# Patient Record
Sex: Female | Born: 1985
Health system: Southern US, Community
[De-identification: ages and names within clinical notes are randomized; demographics above are authoritative.]

## PROBLEM LIST (undated history)

## (undated) ENCOUNTER — Inpatient Hospital Stay (HOSPITAL_COMMUNITY): Payer: Self-pay

## (undated) DIAGNOSIS — F32A Depression, unspecified: Secondary | ICD-10-CM

## (undated) DIAGNOSIS — F329 Major depressive disorder, single episode, unspecified: Secondary | ICD-10-CM

## (undated) DIAGNOSIS — Z789 Other specified health status: Secondary | ICD-10-CM

## (undated) HISTORY — PX: NO PAST SURGERIES: SHX2092

---

## 2018-09-02 ENCOUNTER — Telehealth: Payer: Self-pay | Admitting: Obstetrics and Gynecology

## 2018-09-02 ENCOUNTER — Ambulatory Visit: Payer: BLUE CROSS/BLUE SHIELD | Admitting: Obstetrics and Gynecology

## 2018-09-02 ENCOUNTER — Encounter: Payer: Self-pay | Admitting: Obstetrics and Gynecology

## 2018-09-02 VITALS — BP 126/84 | HR 71 | Ht 60.0 in | Wt 157.0 lb

## 2018-09-02 DIAGNOSIS — B9689 Other specified bacterial agents as the cause of diseases classified elsewhere: Secondary | ICD-10-CM

## 2018-09-02 DIAGNOSIS — N760 Acute vaginitis: Secondary | ICD-10-CM | POA: Diagnosis not present

## 2018-09-02 MED ORDER — METRONIDAZOLE 500 MG PO TABS: 500.0000 mg | ORAL_TABLET | Freq: Two times a day (BID) | ORAL | 0 refills | Status: AC

## 2018-09-02 NOTE — Telephone Encounter (Signed)
Called to confirm pharmacy with Pt and sent Rx.

## 2018-09-02 NOTE — Patient Instructions (Signed)

## 2018-09-02 NOTE — Progress Notes (Signed)
HPI:      [name] is a 32 y.o. G1P0 who LMP was Patient's last menstrual period was 08/28/2018 (exact date).  Subjective:   She presents today with complaint of vaginal odor that she "cannot get rid of".  She states that she had something similar several months ago and was treated for BV.  This is only the second time she is ever had BV.  She has just moved to this area from [place].  She denies new sexual partners and has no infidelity concerns.  Does not want to be tested for GC/CT.  She reports her latest Pap smear was "irregular" but that her HPV was negative. She also would like to discuss the possibility of pregnancy in the near future.  She reports that she has had one miscarriage.  She is currently using condoms for birth control.    Hx: The following portions of the patient's history were reviewed and updated as appropriate:             She  has no past medical history on file. She does not have a problem list on file. She  has no past surgical history on file. Her family history is not on file. She  reports that she has never smoked. She has never used smokeless tobacco. She reports that she does not drink alcohol or use drugs. She currently has no medications in their medication list. She has no allergies on file.       Review of Systems:  Review of Systems  Constitutional: Denied constitutional symptoms, night sweats, recent illness, fatigue, fever, insomnia and weight loss.  Eyes: Denied eye symptoms, eye pain, photophobia, vision change and visual disturbance.  Ears/Nose/Throat/Neck: Denied ear, nose, throat or neck symptoms, hearing loss, nasal discharge, sinus congestion and sore throat.  Cardiovascular: Denied cardiovascular symptoms, arrhythmia, chest pain/pressure, edema, exercise intolerance, orthopnea and palpitations.  Respiratory: Denied pulmonary symptoms, asthma, pleuritic pain, productive sputum, cough, dyspnea and wheezing.  Gastrointestinal:  Denied, gastro-esophageal reflux, melena, nausea and vomiting.  Genitourinary: See HPI for additional information.  Musculoskeletal: Denied musculoskeletal symptoms, stiffness, swelling, muscle weakness and myalgia.  Dermatologic: Denied dermatology symptoms, rash and scar.  Neurologic: Denied neurology symptoms, dizziness, headache, neck pain and syncope.  Psychiatric: Denied psychiatric symptoms, anxiety and depression.  Endocrine: Denied endocrine symptoms including hot flashes and night sweats.   Meds:   No current outpatient medications on file prior to visit.   No current facility-administered medications on file prior to visit.     Objective:     Vitals:   09/02/18 1123  BP: 126/84  Pulse: 71              Physical examination   Pelvic:   Vulva: Normal appearance.  No lesions.  Vagina: No lesions or abnormalities noted.  Support: Normal pelvic support.  Urethra No masses tenderness or scarring.  Meatus Normal size without lesions or prolapse.  Cervix: Normal appearance.  No lesions.  Anus: Normal exam.  No lesions.  Perineum: Normal exam.  No lesions.        Bimanual   Uterus: Normal size.  Non-tender.  Mobile.  AV.  Adnexae: No masses.  Non-tender to palpation.  Cul-de-sac: Negative for abnormality.   WET PREP: clue cells: present, KOH (yeast): negative, odor: present and trichomoniasis: negative Ph:  > 4.5   Assessment:    G1P0 There are no active problems to display for this patient.    1. Bacterial vulvovaginitis  Plan:            1.  Flagyl  2.  We discussed the use of acidophilus and lactobacillus/probiotics in detail.  Literature given.  3.  We discussed preconceptual issues including counseling use of prenatal vitamins timing of intercourse etc.  4.  We discussed her HPV and her "irregular Pap smear".  I have recommended that she has another Pap smear with HPV testing in 1 year. Orders No orders of the defined types were placed in  this encounter.   No orders of the defined types were placed in this encounter.     F/U  Return for Annual Physical. I spent 32 minutes involved in the care of this patient of which greater than 50% was spent discussing vaginal infection, preconceptual counseling, abnormal Pap smears and follow-up for all of the above.  All her questions were answered.  Finis Bud, M.D. 09/02/2018 12:17 PM

## 2018-09-02 NOTE — Progress Notes (Signed)
Pt presents today with symptoms of a bacterial infection. Symptoms include cramping, odor and frequent urination.

## 2018-09-02 NOTE — Telephone Encounter (Signed)
The patient is new to [facility] and our practice, and she thinks she forgot to tell the nurse/provider which pharmacy to snd her script to.  She would like to use [facility] on [address], and she would like to pick these up before she leaves the area to go home if at all possible, please advise, thanks.

## 2018-09-16 ENCOUNTER — Telehealth: Payer: Self-pay | Admitting: Obstetrics and Gynecology

## 2018-09-16 NOTE — Telephone Encounter (Signed)
Pt seen by dje and dx with bv. GIven flagyl. She is now having a white d/c and very itchy.  Advised pt to keep appt with mad.   Pt to see mad in the am.

## 2018-09-16 NOTE — Telephone Encounter (Signed)
He patient called and stated that she was in the office for bv and her medication has caused other symptoms now. The patient has made an apt with Dr. Tennis Must due to her provider being out of the office for a week.Thje patient would like a call back. Please advise.

## 2018-09-16 NOTE — Telephone Encounter (Signed)
I accidentally routed the message to [employer] and she is not in the office. Please advise previous message.

## 2018-09-16 NOTE — Telephone Encounter (Signed)
LMTRC

## 2018-09-17 ENCOUNTER — Encounter: Payer: Self-pay | Admitting: Obstetrics and Gynecology

## 2018-09-17 ENCOUNTER — Ambulatory Visit: Payer: BLUE CROSS/BLUE SHIELD | Admitting: Obstetrics and Gynecology

## 2018-09-17 VITALS — BP 110/77 | HR 78 | Ht 61.0 in | Wt 157.3 lb

## 2018-09-17 DIAGNOSIS — B3731 Acute candidiasis of vulva and vagina: Secondary | ICD-10-CM

## 2018-09-17 DIAGNOSIS — N898 Other specified noninflammatory disorders of vagina: Secondary | ICD-10-CM | POA: Diagnosis not present

## 2018-09-17 DIAGNOSIS — L292 Pruritus vulvae: Secondary | ICD-10-CM

## 2018-09-17 DIAGNOSIS — B373 Candidiasis of vulva and vagina: Secondary | ICD-10-CM

## 2018-09-17 MED ORDER — FLUCONAZOLE 150 MG PO TABS: 150.0000 mg | ORAL_TABLET | ORAL | 3 refills | Status: DC

## 2018-09-17 NOTE — Patient Instructions (Signed)
1.  Wet prep today shows yeast infection. 2.  Take Diflucan 150 mg orally every 3 days for 3 doses. 3.  Follow-up as needed.   Vaginal Yeast infection, Adult Vaginal yeast infection is a condition that causes soreness, swelling, and redness (inflammation) of the vagina. It also causes vaginal discharge. This is a common condition. Some women get this infection frequently. What are the causes? This condition is caused by a change in the normal balance of the yeast (candida) and bacteria that live in the vagina. This change causes an overgrowth of yeast, which causes the inflammation. What increases the risk? This condition is more likely to develop in:  Women who take antibiotic medicines.  Women who have diabetes.  Women who take birth control pills.  Women who are pregnant.  Women who douche often.  Women who have a weak defense (immune) system.  Women who have been taking steroid medicines for a long time.  Women who frequently wear tight clothing.  What are the signs or symptoms? Symptoms of this condition include:  White, thick vaginal discharge.  Swelling, itching, redness, and irritation of the vagina. The lips of the vagina (vulva) may be affected as well.  Pain or a burning feeling while urinating.  Pain during sex.  How is this diagnosed? This condition is diagnosed with a medical history and physical exam. This will include a pelvic exam. Your health care provider will examine a sample of your vaginal discharge under a microscope. Your health care provider may send this sample for testing to confirm the diagnosis. How is this treated? This condition is treated with medicine. Medicines may be over-the-counter or prescription. You may be told to use one or more of the following:  Medicine that is taken orally.  Medicine that is applied as a cream.  Medicine that is inserted directly into the vagina (suppository).  Follow these instructions at home:  Take or  apply over-the-counter and prescription medicines only as told by your health care provider.  Do not have sex until your health care provider has approved. Tell your sex partner that you have a yeast infection. That person should go to his or her health care provider if he or she develops symptoms.  Do not wear tight clothes, such as pantyhose or tight pants.  Avoid using tampons until your health care provider approves.  Eat more yogurt. This may help to keep your yeast infection from returning.  Try taking a sitz bath to help with discomfort. This is a warm water bath that is taken while you are sitting down. The water should only come up to your hips and should cover your buttocks. Do this 3-4 times per day or as told by your health care provider.  Do not douche.  Wear breathable, cotton underwear.  If you have diabetes, keep your blood sugar levels under control. Contact a health care provider if:  You have a fever.  Your symptoms go away and then return.  Your symptoms do not get better with treatment.  Your symptoms get worse.  You have new symptoms.  You develop blisters in or around your vagina.  You have blood coming from your vagina and it is not your menstrual period.  You develop pain in your abdomen. This information is not intended to replace advice given to you by your health care provider. Make sure you discuss any questions you have with your health care provider. Document Released: 09/26/2005 Document Revised: 05/30/2016 Document Reviewed: 06/20/2015 Elsevier  Interactive Patient Education  [employer].

## 2018-09-17 NOTE — Progress Notes (Signed)
Chief complaint: 1.  Vaginal discharge  32 year old married white female para 0-0-1-0, recently evaluated for vaginitis and treated for bacterial vaginosis.  Patient completed full course of Flagyl.  Since that time [place] the beach and has noted new onset vaginal itching, vulvar itching, and a white discharge.  She has not taken any other antibiotics.  She has been prone to yeast infections in the past. No abnormal uterine bleeding.  No UTI symptoms.  History reviewed. No pertinent past medical history. Past Surgical History:  Procedure Laterality Date  . NO PAST SURGERIES     Review of systems: Complete review of systems is negative except for that noted in the HPI  OBJECTIVE: BP 110/77   Pulse 78   Ht 5\' 1"  (1.549 m)   Wt 157 lb 4.8 oz (71.4 kg)   LMP 08/28/2018 (Exact Date)   BMI 29.72 kg/m  Pleasant well-appearing female no acute distress.  Alert and oriented.  Affect is appropriate. Pelvic exam: External genitalia-mild vulvar hyperemia BUS-normal Vagina-thick white discharge, curd-like present; wet prep taken Cervix-no lesions; no discharge Bimanual-not performed Rectovaginal-normal external exam  PROCEDURE: Wet prep KOH-positive yeast Normal saline-no clue cells; few white blood cells; no trichomonas  ASSESSMENT: 1.  Monilia vulvovaginitis  PLAN: 1.  Diflucan 150 mg orally every 3 days x 3 doses 2.  Literature regarding yeast infections and prevention given 3.  Return as needed  Brayton Mars, MD  Note: This dictation was prepared with Dragon dictation along with smaller phrase technology. Any transcriptional errors that result from this process are unintentional.

## 2018-11-22 ENCOUNTER — Inpatient Hospital Stay (HOSPITAL_COMMUNITY)
Admission: AD | Admit: 2018-11-22 | Discharge: 2018-11-23 | Disposition: A | Discharge status: Home / Self Care | Payer: BLUE CROSS/BLUE SHIELD | Attending: Obstetrics and Gynecology | Admitting: Obstetrics and Gynecology

## 2018-11-22 DIAGNOSIS — Z3A08 8 weeks gestation of pregnancy: Secondary | ICD-10-CM | POA: Insufficient documentation

## 2018-11-22 DIAGNOSIS — O209 Hemorrhage in early pregnancy, unspecified: Secondary | ICD-10-CM

## 2018-11-22 DIAGNOSIS — O208 Other hemorrhage in early pregnancy: Secondary | ICD-10-CM | POA: Insufficient documentation

## 2018-11-22 DIAGNOSIS — D259 Leiomyoma of uterus, unspecified: Secondary | ICD-10-CM | POA: Insufficient documentation

## 2018-11-22 DIAGNOSIS — O3411 Maternal care for benign tumor of corpus uteri, first trimester: Secondary | ICD-10-CM | POA: Insufficient documentation

## 2018-11-22 DIAGNOSIS — Z3491 Encounter for supervision of normal pregnancy, unspecified, first trimester: Secondary | ICD-10-CM

## 2018-11-22 HISTORY — DX: Other specified health status: Z78.9

## 2018-11-23 ENCOUNTER — Encounter (HOSPITAL_COMMUNITY): Payer: Self-pay | Admitting: *Deleted

## 2018-11-23 ENCOUNTER — Other Ambulatory Visit: Payer: Self-pay

## 2018-11-23 ENCOUNTER — Inpatient Hospital Stay (HOSPITAL_COMMUNITY): Payer: BLUE CROSS/BLUE SHIELD

## 2018-11-23 DIAGNOSIS — Z3A08 8 weeks gestation of pregnancy: Secondary | ICD-10-CM

## 2018-11-23 DIAGNOSIS — O208 Other hemorrhage in early pregnancy: Secondary | ICD-10-CM

## 2018-11-23 DIAGNOSIS — O3411 Maternal care for benign tumor of corpus uteri, first trimester: Secondary | ICD-10-CM | POA: Diagnosis not present

## 2018-11-23 DIAGNOSIS — D259 Leiomyoma of uterus, unspecified: Secondary | ICD-10-CM | POA: Diagnosis not present

## 2018-11-23 LAB — URINALYSIS, ROUTINE W REFLEX MICROSCOPIC
BILIRUBIN URINE: NEGATIVE
Glucose, UA: NEGATIVE mg/dL
KETONES UR: NEGATIVE mg/dL
LEUKOCYTES UA: NEGATIVE
NITRITE: NEGATIVE
PROTEIN: 30 mg/dL — AB
Specific Gravity, Urine: 1.001 — ABNORMAL LOW (ref 1.005–1.030)
pH: 6 (ref 5.0–8.0)

## 2018-11-23 LAB — CBC
HEMATOCRIT: 38.5 % (ref 36.0–46.0)
HEMOGLOBIN: 13 g/dL (ref 12.0–15.0)
MCH: 32.2 pg (ref 26.0–34.0)
MCHC: 33.8 g/dL (ref 30.0–36.0)
MCV: 95.3 fL (ref 80.0–100.0)
Platelets: 214 10*3/uL (ref 150–400)
RBC: 4.04 MIL/uL (ref 3.87–5.11)
RDW: 11.8 % (ref 11.5–15.5)
WBC: 8.4 10*3/uL (ref 4.0–10.5)

## 2018-11-23 LAB — HCG, QUANTITATIVE, PREGNANCY: hCG, Beta Chain, Quant, S: 32280 m[IU]/mL — ABNORMAL HIGH (ref <5)

## 2018-11-23 LAB — ABO/RH: ABO/RH(D): AB POS

## 2018-11-23 LAB — POCT PREGNANCY, URINE: PREG TEST UR: POSITIVE — AB

## 2018-11-23 NOTE — MAU Provider Note (Signed)
History     CSN: [id number]  Arrival date and time: 11/22/18 2349   First Provider Initiated Contact with Patient 11/23/18 0008      Chief Complaint  Patient presents with  . Vaginal Bleeding   HPI Ann Williams is a 32 y.o. G2P0010 at [redacted]w[redacted]d who presents with heavy vaginal bleeding. She states at 2300 she started having bright red vaginal bleeding with clots. She also reports cramping that she rates a 3/10 and has not tried anything for. She states she has had a miscarriage in the past and feels like this is the same amount of bleeding. She is also passing clots. Upon arrival to [facility]U, she had blood on hands and legs that was falling to the floor. She states she had an ultrasound in the office this week that showed a normal IUP with a heart rate.   OB History    Gravida  2   Para      Term      Preterm      AB  1   Living  0     SAB  1   TAB      Ectopic      Multiple      Live Births              Past Medical History:  Diagnosis Date  . Medical history non-contributory     Past Surgical History:  Procedure Laterality Date  . NO PAST SURGERIES      Family History  Problem Relation Age of Onset  . Breast cancer Neg Hx   . Ovarian cancer Neg Hx   . Colon cancer Neg Hx   . Diabetes Neg Hx     Social History   Tobacco Use  . Smoking status: Never Smoker  . Smokeless tobacco: Never Used  Substance Use Topics  . Alcohol use: Never    Frequency: Never  . Drug use: Never    Allergies: No Known Allergies  Medications Prior to Admission  Medication Sig Dispense Refill Last Dose  . Prenatal Vit-Fe Fumarate-FA (PRENATAL MULTIVITAMIN) TABS tablet Take 1 tablet by mouth daily at 12 noon.   11/22/2018 at Unknown time  . fluconazole (DIFLUCAN) 150 MG tablet Take 1 tablet (150 mg total) by mouth every 3 (three) days. For three doses 3 tablet 3     Review of Systems  Constitutional: Negative.  Negative for fatigue and fever.  HENT: Negative.    Respiratory: Negative.  Negative for shortness of breath.   Cardiovascular: Negative.  Negative for chest pain.  Gastrointestinal: Positive for abdominal pain. Negative for constipation, diarrhea, nausea and vomiting.  Genitourinary: Positive for vaginal bleeding. Negative for dysuria.  Neurological: Negative.  Negative for dizziness and headaches.   Physical Exam   Last menstrual period 09/22/2018.  Physical Exam  Nursing note and vitals reviewed. Constitutional: She is oriented to person, place, and time. She appears well-developed and well-nourished. No distress.  HENT:  Head: Normocephalic.  Eyes: Pupils are equal, round, and reactive to light.  Cardiovascular: Normal rate, regular rhythm and normal heart sounds.  Respiratory: Effort normal and breath sounds normal. No respiratory distress.  GI: Soft. Bowel sounds are normal. She exhibits no distension. There is no tenderness.  Genitourinary:  Genitourinary Comments: Bright red vaginal bleeding on perineum and down inner thighs. Small amount of bright red bleeding in vault and one small clot removed. Cervix closed and thick.   Neurological: She is alert and oriented to person,  place, and time.  Skin: Skin is warm and dry.  Psychiatric: She has a normal mood and affect. Her behavior is normal. Judgment and thought content normal.    MAU Course  Procedures Results for orders placed or performed during the hospital encounter of 11/22/18 (from the past 24 hour(s))  Pregnancy, urine POC     Status: Abnormal   Collection Time: 11/23/18 12:00 AM  Result Value Ref Range   Preg Test, Ur POSITIVE (A) NEGATIVE  Urinalysis, Routine w reflex microscopic     Status: Abnormal   Collection Time: 11/23/18 12:02 AM  Result Value Ref Range   Color, Urine YELLOW YELLOW   APPearance CLEAR CLEAR   Specific Gravity, Urine 1.001 (L) 1.005 - 1.030   pH 6.0 5.0 - 8.0   Glucose, UA NEGATIVE NEGATIVE mg/dL   Hgb urine dipstick LARGE (A) NEGATIVE    Bilirubin Urine NEGATIVE NEGATIVE   Ketones, ur NEGATIVE NEGATIVE mg/dL   Protein, ur 30 (A) NEGATIVE mg/dL   Nitrite NEGATIVE NEGATIVE   Leukocytes, UA NEGATIVE NEGATIVE   RBC / HPF 0-5 0 - 5 RBC/hpf   WBC, UA 0-5 0 - 5 WBC/hpf   Bacteria, UA RARE (A) NONE SEEN   Squamous Epithelial / LPF 0-5 0 - 5  CBC     Status: None   Collection Time: 11/23/18 12:22 AM  Result Value Ref Range   WBC 8.4 4.0 - 10.5 K/uL   RBC 4.04 3.87 - 5.11 MIL/uL   Hemoglobin 13.0 12.0 - 15.0 g/dL   HCT 38.5 36.0 - 46.0 %   MCV 95.3 80.0 - 100.0 fL   MCH 32.2 26.0 - 34.0 pg   MCHC 33.8 30.0 - 36.0 g/dL   RDW 11.8 11.5 - 15.5 %   Platelets 214 150 - 400 K/uL  hCG, quantitative, pregnancy     Status: Abnormal   Collection Time: 11/23/18 12:22 AM  Result Value Ref Range   hCG, Beta Chain, Quant, S 32,280 (H) <5 mIU/mL  ABO/Rh     Status: None (Preliminary result)   Collection Time: 11/23/18 12:22 AM  Result Value Ref Range   ABO/RH(D)      AB POS Performed at [facility], [address], [place], [place] [address]    US Ob Comp Less 14 Wks  Result Date: 11/23/2018 CLINICAL DATA:  Heavy bleeding. Estimated gestational age by LMP is 8 weeks 6 days. No quantitative beta hCG given. EXAM: OBSTETRIC <14 WK ULTRASOUND TECHNIQUE: Transabdominal ultrasound was performed for evaluation of the gestation as well as the maternal uterus and adnexal regions. COMPARISON:  None. FINDINGS: Intrauterine gestational sac: A single intrauterine gestational sac is present. The gestational sac is somewhat flattened, possibly related to uterine fibroid. Yolk sac:  Yolk sac is identified. Embryo:  Fetal pole is visualized. Cardiac Activity: Fetal cardiac activity is observed. Heart Rate: 182 bpm CRL: 18.7 mm   8 w 2 d                  [place] EDC: 07/03/2019 Subchorionic hemorrhage: A small subchorionic hemorrhage is demonstrated superiorly. Maternal uterus/adnexae: Uterus is anteverted. Hypoechoic myometrial mass demonstrated in the  anterior uterine fundus measuring up to about 3.2 cm in diameter consistent with a fibroid. Both ovaries are visualized and appear normal. No free fluid in the pelvis. IMPRESSION: Single intrauterine pregnancy. Estimated gestational age by crown-rump length is 8 weeks 2 days. Small subchorionic hemorrhage. An anterior uterine fibroid is present. Electronically Signed   By:  Lucienne Capers M.D.   On: 11/23/2018 00:59   MDM UA, UPT CBC, HCG ABO/Rh- AB Pos Wet prep and gc/chlamydia US OB Comp Less 14 weeks with Transvaginal  No subchorionic hemorrhage seen but patient reports one was noted on ultrasound in the office. Explained implications to patient and husband and reassurance provided.  Assessment and Plan   1. Normal intrauterine pregnancy on prenatal ultrasound in first trimester   2. Vaginal bleeding affecting early pregnancy   3. [redacted] weeks gestation of pregnancy    -Discharge home in stable condition -Vaginal bleeding precautions discussed -Patient advised to follow-up with [facility] as scheduled on 12/2 -Patient may return to MAU as needed or if her condition were to change or worsen  Wende Mott CNM 11/23/2018, 12:08 AM

## 2018-11-23 NOTE — MAU Note (Signed)
Pt reports to MAU c/o heavy vaginal bleeding that started around 2345 bright red with some clots. Pt reports a mild cramping that is a 3/10.

## 2018-11-23 NOTE — Discharge Instructions (Signed)
Pelvic Rest Pelvic rest may be recommended if:  Your placenta is partially or completely covering the opening of your cervix (placenta previa).  There is bleeding between the wall of the uterus and the amniotic sac in the first trimester of pregnancy (subchorionic hemorrhage).  You went into labor too early (preterm labor).  Based on your overall health and the health of your baby, your health care provider will decide if pelvic rest is right for you. How do I rest my pelvis? For as long as told by your health care provider:  Do not have sex, sexual stimulation, or an orgasm.  Do not use tampons. Do not douche. Do not put anything in your vagina.  Do not lift anything that is heavier than 10 lb (4.5 kg).  Avoid activities that take a lot of effort (are strenuous).  Avoid any activity in which your pelvic muscles could become strained.  When should I seek medical care? Seek medical care if you have:  Cramping pain in your lower abdomen.  Vaginal discharge.  A low, dull backache.  Regular contractions.  Uterine tightening.  When should I seek immediate medical care? Seek immediate medical care if:  You have vaginal bleeding and you are pregnant.  This information is not intended to replace advice given to you by your health care provider. Make sure you discuss any questions you have with your health care provider. Document Released: 04/13/2011 Document Revised: 05/24/2016 Document Reviewed: 06/20/2015 Elsevier Interactive Patient Education  2018 [place] Medications in Pregnancy   Acne: Benzoyl Peroxide Salicylic Acid  Backache/Headache: Tylenol: 2 regular strength every 4 hours OR              2 Extra strength every 6 hours  Colds/Coughs/Allergies: Benadryl (alcohol free) 25 mg every 6 hours as needed Breath right strips Claritin Cepacol throat lozenges Chloraseptic throat spray Cold-Eeze- up to three times per day Cough drops, alcohol  free Flonase (by prescription only) Guaifenesin Mucinex Robitussin DM (plain only, alcohol free) Saline nasal spray/drops Sudafed (pseudoephedrine) & Actifed ** use only after [redacted] weeks gestation and if you do not have high blood pressure Tylenol Vicks Vaporub Zinc lozenges Zyrtec   Constipation: Colace Ducolax suppositories Fleet enema Glycerin suppositories Metamucil Milk of magnesia Miralax Senokot Smooth move tea  Diarrhea: Kaopectate Imodium A-D  *NO pepto Bismol  Hemorrhoids: Anusol Anusol HC Preparation H Tucks  Indigestion: Tums Maalox Mylanta Zantac  Pepcid  Insomnia: Benadryl (alcohol free) 25mg  every 6 hours as needed Tylenol PM Unisom, no Gelcaps  Leg Cramps: Tums MagGel  Nausea/Vomiting:  Bonine Dramamine Emetrol Ginger extract Sea bands Meclizine  Nausea medication to take during pregnancy:  Unisom (doxylamine succinate 25 mg tablets) Take one tablet daily at bedtime. If symptoms are not adequately controlled, the dose can be increased to a maximum recommended dose of two tablets daily (1/2 tablet in the morning, 1/2 tablet mid-afternoon and one at bedtime). Vitamin B6 100mg  tablets. Take one tablet twice a day (up to 200 mg per day).  Skin Rashes: Aveeno products Benadryl cream or 25mg  every 6 hours as needed Calamine Lotion 1% cortisone cream  Yeast infection: Gyne-lotrimin 7 Monistat 7   **If taking multiple medications, please check labels to avoid duplicating the same active ingredients **take medication as directed on the label ** Do not exceed 4000 mg of tylenol in 24 hours **Do not take medications that contain aspirin or ibuprofen

## 2018-12-02 ENCOUNTER — Encounter (HOSPITAL_COMMUNITY): Payer: Self-pay

## 2018-12-02 ENCOUNTER — Inpatient Hospital Stay (HOSPITAL_COMMUNITY)
Admission: AD | Admit: 2018-12-02 | Discharge: 2018-12-02 | Disposition: A | Discharge status: Home / Self Care | Payer: BLUE CROSS/BLUE SHIELD | Attending: Obstetrics & Gynecology | Admitting: Obstetrics & Gynecology

## 2018-12-02 DIAGNOSIS — O209 Hemorrhage in early pregnancy, unspecified: Secondary | ICD-10-CM | POA: Insufficient documentation

## 2018-12-02 DIAGNOSIS — Z3A10 10 weeks gestation of pregnancy: Secondary | ICD-10-CM | POA: Diagnosis not present

## 2018-12-02 DIAGNOSIS — O26891 Other specified pregnancy related conditions, first trimester: Secondary | ICD-10-CM

## 2018-12-02 NOTE — MAU Provider Note (Signed)
History     CSN: [id number]  Arrival date and time: 12/02/18 2054   First Provider Initiated Contact with Patient 12/02/18 2142      Chief Complaint  Patient presents with  . Vaginal Bleeding   HPI Ann Williams is a 32 y.o. G2P0010 at [redacted]w[redacted]d who presents with vaginal bleeding. She states starting at 2000 she had bright red bleeding like a period and passed several small clots. She denies any pain. She states she had an appointment yesterday where they confirmed [facility] and did a pelvic exam.   OB History    Gravida  2   Para      Term      Preterm      AB  1   Living  0     SAB  1   TAB      Ectopic      Multiple      Live Births              Past Medical History:  Diagnosis Date  . Medical history non-contributory     Past Surgical History:  Procedure Laterality Date  . NO PAST SURGERIES      Family History  Problem Relation Age of Onset  . Breast cancer Neg Hx   . Ovarian cancer Neg Hx   . Colon cancer Neg Hx   . Diabetes Neg Hx     Social History   Tobacco Use  . Smoking status: Never Smoker  . Smokeless tobacco: Never Used  Substance Use Topics  . Alcohol use: Never    Frequency: Never  . Drug use: Never    Allergies: No Known Allergies  Medications Prior to Admission  Medication Sig Dispense Refill Last Dose  . Prenatal Vit-Fe Fumarate-FA (PRENATAL MULTIVITAMIN) TABS tablet Take 1 tablet by mouth daily at 12 noon.   12/02/2018 at Unknown time  . fluconazole (DIFLUCAN) 150 MG tablet Take 1 tablet (150 mg total) by mouth every 3 (three) days. For three doses 3 tablet 3     Review of Systems  Constitutional: Negative.  Negative for fatigue and fever.  HENT: Negative.   Respiratory: Negative.  Negative for shortness of breath.   Cardiovascular: Negative.  Negative for chest pain.  Gastrointestinal: Negative.  Negative for abdominal pain, constipation, diarrhea, nausea and vomiting.  Genitourinary: Positive for vaginal bleeding.  Negative for dysuria.  Neurological: Negative.  Negative for dizziness and headaches.   Physical Exam   Blood pressure 119/69, pulse 69, temperature 98.6 F (37 C), temperature source Oral, resp. rate 20, height 5' (1.524 m), weight 73.9 kg, last menstrual period 09/22/2018.  Physical Exam  Nursing note and vitals reviewed. Constitutional: She is oriented to person, place, and time. She appears well-developed and well-nourished. No distress.  HENT:  Head: Normocephalic.  Eyes: Pupils are equal, round, and reactive to light.  Cardiovascular: Normal rate, regular rhythm and normal heart sounds.  Respiratory: Effort normal and breath sounds normal. No respiratory distress.  GI: Soft. Bowel sounds are normal. She exhibits no distension. There is no tenderness.  Genitourinary:  Genitourinary Comments: Small amount of bright red bleeding in vault. No active bleeding. Cervix closed  Neurological: She is alert and oriented to person, place, and time.  Skin: Skin is warm and dry.  Psychiatric: She has a normal mood and affect. Her behavior is normal. Judgment and thought content normal.    MAU Course  Procedures  MDM AB Pos Limited bedside u/s shows IUP measuring  10w with FHR 158 bpm Small blood on exam.  Reassurance provided and discussed pelvic rest and return precautions  Assessment and Plan   1. Vaginal bleeding in pregnancy, first trimester   2. [redacted] weeks gestation of pregnancy    -Discharge home in stable condition -Vaginal bleeding precautions discussed -Patient advised to follow-up with [facility] as scheduled on 12/17 -Patient may return to MAU as needed or if her condition were to change or worsen  Wende Mott CNM 12/02/2018, 9:42 PM

## 2018-12-02 NOTE — Discharge Instructions (Signed)
Pelvic Rest Pelvic rest may be recommended if:  Your placenta is partially or completely covering the opening of your cervix (placenta previa).  There is bleeding between the wall of the uterus and the amniotic sac in the first trimester of pregnancy (subchorionic hemorrhage).  You went into labor too early (preterm labor).  Based on your overall health and the health of your baby, your health care provider will decide if pelvic rest is right for you. How do I rest my pelvis? For as long as told by your health care provider:  Do not have sex, sexual stimulation, or an orgasm.  Do not use tampons. Do not douche. Do not put anything in your vagina.  Do not lift anything that is heavier than 10 lb (4.5 kg).  Avoid activities that take a lot of effort (are strenuous).  Avoid any activity in which your pelvic muscles could become strained.  When should I seek medical care? Seek medical care if you have:  Cramping pain in your lower abdomen.  Vaginal discharge.  A low, dull backache.  Regular contractions.  Uterine tightening.  When should I seek immediate medical care? Seek immediate medical care if:  You have vaginal bleeding and you are pregnant.  This information is not intended to replace advice given to you by your health care provider. Make sure you discuss any questions you have with your health care provider. Document Released: 04/13/2011 Document Revised: 05/24/2016 Document Reviewed: 06/20/2015 Elsevier Interactive Patient Education  [employer].

## 2018-12-02 NOTE — MAU Note (Signed)
AVS signature pad not working. Pt signed paper copy of AVS

## 2018-12-02 NOTE — MAU Note (Signed)
PT SAYS  SHE STARTED CRAMPING AT 830-  ON LEFT SIDE . WAS HERE BEFORE  BC OF BLOOD CLOTS.  YESTERDAY IN OFFICE  HAD VAG  BLEEDING- NO U/S-  GOOD FHR.      SHE SAT  DOWN AT 815PM-   BLOOD ON CLOTHES- THEN ON LEGS-   HAS  PAPERTOWELS-   IN  TRIAGE.  - RED BLOOD .-  SMALL  AMT.    LAST SEX-  SAT.

## 2018-12-31 NOTE — L&D Delivery Note (Signed)
Delivery Note  CNM in house - called by Dr Ronita Hipps to attend birth HX PPROM in palliative care - IUGR and PPROM@24  wks  Labor and Birth: PPROM 02/27/2019 at 2100 Labor onset: 03/13/2019 ~ 0400  delivery of a non-viable female at 79 by Dr Harolyn Rutherford  breech position transferred baby to NICU team - unable to resuscitate with Apgar 0-0  CNM arrival after delivery - assumed care for Dr Ronita Hipps  Third Stage: Placenta delivered 0759 intact with maternal efforts Placenta disposition: pathology Uterine tone firm / bleeding small - Pitocin to IVF bolus rate  no laceration identified  Est. Blood Loss (mL): 845XM  Complications: fetal death anticipated due to IUGR and PPROM since [redacted] weeks gestation  Mom to special care unit - 1st floor  Newborn: Apgar Scores: 1-minute:  0                         5-minute: 0  Baby for transfer to [place] - uncertain final plans for baby at this time   [place], MSN, [facility] 03/13/2019, 8:09 AM

## 2019-02-03 ENCOUNTER — Encounter (HOSPITAL_COMMUNITY): Payer: Self-pay

## 2019-02-04 ENCOUNTER — Other Ambulatory Visit (HOSPITAL_COMMUNITY): Payer: Self-pay | Admitting: Obstetrics

## 2019-02-04 DIAGNOSIS — Z3A19 19 weeks gestation of pregnancy: Secondary | ICD-10-CM

## 2019-02-04 DIAGNOSIS — O365920 Maternal care for other known or suspected poor fetal growth, second trimester, not applicable or unspecified: Secondary | ICD-10-CM

## 2019-02-04 DIAGNOSIS — Z3689 Encounter for other specified antenatal screening: Secondary | ICD-10-CM

## 2019-02-09 ENCOUNTER — Encounter (HOSPITAL_COMMUNITY): Payer: Self-pay

## 2019-02-09 ENCOUNTER — Other Ambulatory Visit: Payer: Self-pay

## 2019-02-09 ENCOUNTER — Ambulatory Visit (HOSPITAL_COMMUNITY)
Admission: RE | Admit: 2019-02-09 | Discharge: 2019-02-09 | Disposition: A | Discharge status: Home / Self Care | Payer: BLUE CROSS/BLUE SHIELD | Source: Ambulatory Visit | Attending: Obstetrics | Admitting: Obstetrics

## 2019-02-09 ENCOUNTER — Other Ambulatory Visit (HOSPITAL_COMMUNITY): Payer: Self-pay | Admitting: Obstetrics

## 2019-02-09 DIAGNOSIS — Z3A20 20 weeks gestation of pregnancy: Secondary | ICD-10-CM

## 2019-02-09 DIAGNOSIS — Z3A19 19 weeks gestation of pregnancy: Secondary | ICD-10-CM

## 2019-02-09 DIAGNOSIS — Z3689 Encounter for other specified antenatal screening: Secondary | ICD-10-CM

## 2019-02-09 DIAGNOSIS — Z363 Encounter for antenatal screening for malformations: Secondary | ICD-10-CM | POA: Diagnosis not present

## 2019-02-09 DIAGNOSIS — O365990 Maternal care for other known or suspected poor fetal growth, unspecified trimester, not applicable or unspecified: Secondary | ICD-10-CM | POA: Diagnosis not present

## 2019-02-09 DIAGNOSIS — O365920 Maternal care for other known or suspected poor fetal growth, second trimester, not applicable or unspecified: Secondary | ICD-10-CM

## 2019-02-09 DIAGNOSIS — O4100X0 Oligohydramnios, unspecified trimester, not applicable or unspecified: Secondary | ICD-10-CM

## 2019-02-09 HISTORY — DX: Depression, unspecified: F32.A

## 2019-02-09 HISTORY — DX: Major depressive disorder, single episode, unspecified: F32.9

## 2019-02-10 ENCOUNTER — Other Ambulatory Visit (HOSPITAL_COMMUNITY): Payer: Self-pay | Admitting: *Deleted

## 2019-02-10 DIAGNOSIS — Z362 Encounter for other antenatal screening follow-up: Secondary | ICD-10-CM

## 2019-02-10 DIAGNOSIS — IMO0002 Reserved for concepts with insufficient information to code with codable children: Secondary | ICD-10-CM

## 2019-02-27 ENCOUNTER — Inpatient Hospital Stay (HOSPITAL_COMMUNITY)
Admission: AD | Admit: 2019-02-27 | Discharge: 2019-02-28 | Disposition: A | Discharge status: Home / Self Care | Payer: BLUE CROSS/BLUE SHIELD | Attending: Obstetrics and Gynecology | Admitting: Obstetrics and Gynecology

## 2019-02-27 DIAGNOSIS — O42919 Preterm premature rupture of membranes, unspecified as to length of time between rupture and onset of labor, unspecified trimester: Secondary | ICD-10-CM

## 2019-02-27 DIAGNOSIS — Z3A22 22 weeks gestation of pregnancy: Secondary | ICD-10-CM | POA: Insufficient documentation

## 2019-02-27 DIAGNOSIS — O42912 Preterm premature rupture of membranes, unspecified as to length of time between rupture and onset of labor, second trimester: Secondary | ICD-10-CM | POA: Insufficient documentation

## 2019-02-27 DIAGNOSIS — O365920 Maternal care for other known or suspected poor fetal growth, second trimester, not applicable or unspecified: Secondary | ICD-10-CM | POA: Insufficient documentation

## 2019-02-27 NOTE — MAU Note (Signed)
Was at work yesterday and bled thru clothes. Had u/s at office yesterday after bleeding and no visible cause for bleeding. No bleeding today. Leaked clear fld 3 times since 2100. Hx low fld with this pregnancy and seeing MFM. Had intercourse TUes. No pain

## 2019-02-28 ENCOUNTER — Encounter (HOSPITAL_COMMUNITY): Payer: Self-pay | Admitting: *Deleted

## 2019-02-28 DIAGNOSIS — O42012 Preterm premature rupture of membranes, onset of labor within 24 hours of rupture, second trimester: Secondary | ICD-10-CM

## 2019-02-28 DIAGNOSIS — Z3A22 22 weeks gestation of pregnancy: Secondary | ICD-10-CM | POA: Diagnosis not present

## 2019-02-28 DIAGNOSIS — O365920 Maternal care for other known or suspected poor fetal growth, second trimester, not applicable or unspecified: Secondary | ICD-10-CM | POA: Diagnosis not present

## 2019-02-28 DIAGNOSIS — O42912 Preterm premature rupture of membranes, unspecified as to length of time between rupture and onset of labor, second trimester: Secondary | ICD-10-CM | POA: Diagnosis not present

## 2019-02-28 DIAGNOSIS — N898 Other specified noninflammatory disorders of vagina: Secondary | ICD-10-CM | POA: Diagnosis present

## 2019-02-28 NOTE — Discharge Instructions (Signed)
Premature Rupture and Preterm Premature Rupture of Membranes  A sac made up of membranes surrounds your baby in the womb (uterus). Rupture of membranes is when this sac breaks open. This is also known as your "water breaking." When this sac breaks before labor starts, it is called premature rupture of membranes (PROM). If this happens before 37 weeks of being pregnant, it is called preterm premature rupture of membranes (PPROM). PPROM is serious. It needs medical care right away. What increases the risk of PPROM? PPROM is more likely to happen in women who:  Have an infection.  Have had PPROM before.  Have a cervix that is short.  Have bleeding during the second or third trimester.  Have a low BMI. This is a measure of body fat.  Smoke.  Use drugs.  Have a low socioeconomic status. What problems can be caused by PROM and PPROM? This condition creates health dangers for the mother and the baby. These include:  Giving birth to the baby too early (prematurely).  Getting a serious infection of the placenta (chorioamnionitis).  Having the placenta detach from the uterus early (placental abruption).  Squeezing of the umbilical cord.  Getting a serious infection after delivery. What are the signs of PROM and PPROM?  A sudden gush of fluid from the vagina.  A slow leak of fluid from the vagina.  Your underwear is wet. What should I do if I think my water broke? Call your doctor right away. You will need to go to the hospital to get checked right away. What happens if I am told that I have PROM or PPROM? You will have tests done at the hospital.  If you have PROM, you may be given medicine to start labor (be induced). This may be done if you are not having contractions during the 24 hours after your water broke.  If you have PPROM and are not having contractions, you may be given medicine to start labor. It will depend on how far along you are in your pregnancy. If you have  PPROM:  You and your baby will be watched closely to see if you have infections or other problems.  You may be given: ? An antibiotic medicine. This can stop an infection from starting. ? A steroid medicine. This can help your baby's lungs develop faster. ? A medicine to help prevent cerebral palsy in your baby. ? A medicine to stop early labor (preterm labor).  You may be told to stay in bed except to use the bathroom (bed rest).  You may be given medicine to start labor. This may be done if there are problems with you or the baby. Your treatment will depend on many factors. Contact a doctor if:  Your water breaks and you are not having contractions. Get help right away if:  Your water breaks before you are [redacted] weeks pregnant. Summary  When your water breaks before labor starts, it is called premature rupture of membranes (PROM).  When PROM happens before 37 weeks of pregnancy, it is called preterm premature rupture of membranes (PPROM).  If you are not having contractions, your labor may be started for you. This information is not intended to replace advice given to you by your health care provider. Make sure you discuss any questions you have with your health care provider. Document Released: 03/15/2009 Document Revised: 08/02/2017 Document Reviewed: 09/06/2016 Elsevier Interactive Patient Education  2019 [employer].

## 2019-02-28 NOTE — MAU Provider Note (Signed)
History     CSN: [id number]  Arrival date and time: 02/27/19 2333   First Provider Initiated Contact with Patient 02/28/19 0019      Chief Complaint  Patient presents with  . Vaginal Discharge   G2P0010 @22 .5 wks presenting with LOF. Leaking started around 9pm tonight. Had 3 episodes. Fluid is clear, watery, with pink tinge. Denies abd pain or ctx. Feeling FM. No fevers. Her pregnancy is complicated by persistent oligohydramnios and IUGR.    OB History    Gravida  2   Para      Term      Preterm      AB  1   Living  0     SAB  1   TAB      Ectopic      Multiple      Live Births              Past Medical History:  Diagnosis Date  . Depression   . Medical history non-contributory     Past Surgical History:  Procedure Laterality Date  . NO PAST SURGERIES      Family History  Problem Relation Age of Onset  . Hypertension Mother   . Hypertension Maternal Grandmother   . Breast cancer Neg Hx   . Ovarian cancer Neg Hx   . Colon cancer Neg Hx   . Diabetes Neg Hx     Social History   Tobacco Use  . Smoking status: Never Smoker  . Smokeless tobacco: Never Used  Substance Use Topics  . Alcohol use: Never    Frequency: Never  . Drug use: Never    Allergies: No Known Allergies  No medications prior to admission.    Review of Systems  Gastrointestinal: Negative for abdominal pain.  Genitourinary: Positive for vaginal discharge. Negative for vaginal bleeding.   Physical Exam   Blood pressure 124/80, pulse 73, temperature 98.5 F (36.9 C), temperature source Oral, resp. rate 17, height 5' (1.524 m), weight 77.1 kg, last menstrual period 09/22/2018.  Physical Exam  Nursing note and vitals reviewed. Constitutional: She is oriented to person, place, and time. She appears well-developed and well-nourished. No distress.  HENT:  Head: Normocephalic and atraumatic.  Neck: Normal range of motion.  Cardiovascular: Normal rate.  Respiratory:  Effort normal. No respiratory distress.  GI: Soft. She exhibits no distension. There is no abdominal tenderness.  gravid  Genitourinary:    Genitourinary Comments: SSE: +pool, fern pos, visually closed   Musculoskeletal: Normal range of motion.  Neurological: She is alert and oriented to person, place, and time.  Skin: Skin is warm and dry.  Psychiatric: She has a normal mood and affect.  FHT 147 [place]: none  No results found for this or any previous visit (from the past 24 hour(s)).  MAU Course  Procedures Orders Placed This Encounter  Procedures  . Urinalysis, Routine w reflex microscopic    Standing Status:   Standing    Number of Occurrences:   1  . Discharge patient    Order Specific Question:   Discharge disposition    Answer:   01-Home or Self Care [1]    Order Specific Question:   Discharge patient date    Answer:   02/28/2019   MDM Labs ordered and reviewed. PPROM confirmed. No evidence of chorio. Consult with Dr. Ronita Hipps, recommends expectant mngt outpt  (already discussed with pt at length in office, poor prognosis since persistent oligo and IUGR). Discussed with  pt and spouse. Strict return precautions (sings of infection or PTL). Stable for discharge home.  Assessment and Plan   1. [redacted] weeks gestation of pregnancy   2. Preterm premature rupture of membranes (PPROM) with unknown onset of labor    Discharge home Follow up with MFM in 3 days as scheduled Follow up with Dr. Ronita Hipps next week as scheduled PTL precautions Return precautions  Allergies as of 02/28/2019   No Known Allergies     Medication List    TAKE these medications   prenatal multivitamin Tabs tablet Take 1 tablet by mouth daily at 12 noon.      Julianne Handler, CNM 02/28/2019, 2:01 AM

## 2019-03-02 ENCOUNTER — Ambulatory Visit (HOSPITAL_COMMUNITY)
Admission: RE | Admit: 2019-03-02 | Discharge: 2019-03-02 | Disposition: A | Discharge status: Home / Self Care | Payer: BLUE CROSS/BLUE SHIELD | Source: Ambulatory Visit | Attending: Obstetrics | Admitting: Obstetrics

## 2019-03-02 ENCOUNTER — Inpatient Hospital Stay (HOSPITAL_COMMUNITY)
Admission: AD | Admit: 2019-03-02 | Discharge: 2019-03-05 | DRG: 833 | Disposition: A | Discharge status: Home / Self Care | Payer: BLUE CROSS/BLUE SHIELD | Attending: Obstetrics and Gynecology | Admitting: Obstetrics and Gynecology

## 2019-03-02 ENCOUNTER — Encounter (HOSPITAL_COMMUNITY): Payer: Self-pay

## 2019-03-02 ENCOUNTER — Other Ambulatory Visit: Payer: Self-pay

## 2019-03-02 ENCOUNTER — Ambulatory Visit (HOSPITAL_BASED_OUTPATIENT_CLINIC_OR_DEPARTMENT_OTHER): Payer: BLUE CROSS/BLUE SHIELD | Admitting: *Deleted

## 2019-03-02 VITALS — BP 134/91 | HR 92 | Wt 171.0 lb

## 2019-03-02 DIAGNOSIS — Z3A22 22 weeks gestation of pregnancy: Secondary | ICD-10-CM

## 2019-03-02 DIAGNOSIS — O321XX0 Maternal care for breech presentation, not applicable or unspecified: Secondary | ICD-10-CM | POA: Diagnosis present

## 2019-03-02 DIAGNOSIS — Z362 Encounter for other antenatal screening follow-up: Secondary | ICD-10-CM

## 2019-03-02 DIAGNOSIS — O4102X0 Oligohydramnios, second trimester, not applicable or unspecified: Secondary | ICD-10-CM | POA: Diagnosis not present

## 2019-03-02 DIAGNOSIS — Z3A23 23 weeks gestation of pregnancy: Secondary | ICD-10-CM

## 2019-03-02 DIAGNOSIS — O4692 Antepartum hemorrhage, unspecified, second trimester: Secondary | ICD-10-CM | POA: Diagnosis not present

## 2019-03-02 DIAGNOSIS — O365920 Maternal care for other known or suspected poor fetal growth, second trimester, not applicable or unspecified: Secondary | ICD-10-CM | POA: Diagnosis present

## 2019-03-02 DIAGNOSIS — O0990 Supervision of high risk pregnancy, unspecified, unspecified trimester: Secondary | ICD-10-CM

## 2019-03-02 DIAGNOSIS — O368120 Decreased fetal movements, second trimester, not applicable or unspecified: Secondary | ICD-10-CM | POA: Diagnosis not present

## 2019-03-02 DIAGNOSIS — O4290 Premature rupture of membranes, unspecified as to length of time between rupture and onset of labor, unspecified weeks of gestation: Secondary | ICD-10-CM | POA: Diagnosis not present

## 2019-03-02 DIAGNOSIS — O42912 Preterm premature rupture of membranes, unspecified as to length of time between rupture and onset of labor, second trimester: Principal | ICD-10-CM | POA: Diagnosis present

## 2019-03-02 DIAGNOSIS — IMO0002 Reserved for concepts with insufficient information to code with codable children: Secondary | ICD-10-CM

## 2019-03-02 DIAGNOSIS — O0992 Supervision of high risk pregnancy, unspecified, second trimester: Secondary | ICD-10-CM | POA: Diagnosis not present

## 2019-03-02 LAB — CBC
HCT: 34.5 % — ABNORMAL LOW (ref 36.0–46.0)
Hemoglobin: 11.9 g/dL — ABNORMAL LOW (ref 12.0–15.0)
MCH: 32.2 pg (ref 26.0–34.0)
MCHC: 34.5 g/dL (ref 30.0–36.0)
MCV: 93.2 fL (ref 80.0–100.0)
Platelets: 230 10*3/uL (ref 150–400)
RBC: 3.7 MIL/uL — ABNORMAL LOW (ref 3.87–5.11)
RDW: 12.2 % (ref 11.5–15.5)
WBC: 12.3 10*3/uL — ABNORMAL HIGH (ref 4.0–10.5)
nRBC: 0 % (ref 0.0–0.2)

## 2019-03-02 LAB — TYPE AND SCREEN
ABO/RH(D): AB POS
Antibody Screen: NEGATIVE

## 2019-03-02 LAB — ABO/RH: ABO/RH(D): AB POS

## 2019-03-02 MED ORDER — SODIUM CHLORIDE 0.9 % IV SOLN
250.0000 mL | INTRAVENOUS | Status: DC | PRN
  Administered 2019-03-03: 10 mL via INTRAVENOUS

## 2019-03-02 MED ORDER — SODIUM CHLORIDE 0.9 % IV SOLN
500.0000 mg | INTRAVENOUS | Status: AC
  Administered 2019-03-03: 500 mg via INTRAVENOUS
  Filled 2019-03-02: qty 500

## 2019-03-02 MED ORDER — ZOLPIDEM TARTRATE 5 MG PO TABS: 5.0000 mg | ORAL_TABLET | Freq: Every evening | ORAL | Status: DC | PRN

## 2019-03-02 MED ORDER — ACETAMINOPHEN 325 MG PO TABS: 650.0000 mg | ORAL_TABLET | ORAL | Status: DC | PRN

## 2019-03-02 MED ORDER — SODIUM CHLORIDE 0.9% FLUSH: 3.0000 mL | INTRAVENOUS | Status: DC | PRN

## 2019-03-02 MED ORDER — DOCUSATE SODIUM 100 MG PO CAPS
100.0000 mg | ORAL_CAPSULE | Freq: Every day | ORAL | Status: DC
  Administered 2019-03-03 – 2019-03-05 (×3): 100 mg via ORAL
  Filled 2019-03-02 (×3): qty 1

## 2019-03-02 MED ORDER — AMOXICILLIN 500 MG PO CAPS
500.0000 mg | ORAL_CAPSULE | Freq: Three times a day (TID) | ORAL | Status: DC
  Administered 2019-03-04 – 2019-03-05 (×3): 500 mg via ORAL
  Filled 2019-03-02 (×3): qty 1

## 2019-03-02 MED ORDER — SODIUM CHLORIDE 0.9% FLUSH
3.0000 mL | Freq: Two times a day (BID) | INTRAVENOUS | Status: DC
  Administered 2019-03-04: 3 mL via INTRAVENOUS

## 2019-03-02 MED ORDER — AZITHROMYCIN 250 MG PO TABS
500.0000 mg | ORAL_TABLET | Freq: Every day | ORAL | Status: DC
  Administered 2019-03-04 – 2019-03-05 (×2): 500 mg via ORAL
  Filled 2019-03-02 (×2): qty 2

## 2019-03-02 MED ORDER — PRENATAL MULTIVITAMIN CH
1.0000 | ORAL_TABLET | Freq: Every day | ORAL | Status: DC
  Administered 2019-03-03 – 2019-03-05 (×3): 1 via ORAL
  Filled 2019-03-02 (×3): qty 1

## 2019-03-02 MED ORDER — CALCIUM CARBONATE ANTACID 500 MG PO CHEW: 2.0000 | CHEWABLE_TABLET | ORAL | Status: DC | PRN

## 2019-03-02 MED ORDER — SODIUM CHLORIDE 0.9 % IV SOLN
2.0000 g | Freq: Four times a day (QID) | INTRAVENOUS | Status: AC
  Administered 2019-03-02 – 2019-03-04 (×8): 2 g via INTRAVENOUS
  Filled 2019-03-02 (×8): qty 2000

## 2019-03-02 NOTE — H&P (Signed)
NAMEJENASCIA, Ann Williams MEDICAL RECORD [id number] ACCOUNT [id number] DATE OF BIRTH:[birth year] FACILITY: [facility] LOCATION: [facility] PHYSICIAN:Phoebe Marter J. Jackson Fetters, MD  HISTORY AND PHYSICAL  DATE OF ADMISSION:  03/02/2019  ADMISSION DIAGNOSIS:  Previable preterm ruptured membranes at 22 weeks and 5 days, now for inpatient hospitalization to rule out chorioamnionitis.  HISTORY OF PRESENT ILLNESS:  She is a 33 year old white female G2 P1 with known IUGR and oligohydramnios during the course of the pregnancy, normal cell free DNA who presents now at 23 weeks status post a consultation on ultrasound in maternal fetal  medicine today for admission.  She has a history of leakage of fluid with positive fern and Nitrazine at 22 weeks and 5 days.  She denies fever, chills or abdominal tenderness.  She has intermittent vaginal spotting.  She reports fetal movement.  ALLERGIES:  She has no known drug allergies.  MEDICATIONS:  Prenatal vitamins and baby aspirin.  SOCIAL HISTORY:  She is a nonsmoker, nondrinker.  She denies domestic physical violence.  PAST MEDICAL HISTORY:  She has a history of 1 previous miscarriage in 2016.  PHYSICAL EXAMINATION: GENERAL:  She is a well-developed, well-nourished white female in no acute distress. HEENT:  Normal. NECK:  Supple, full range of motion. LUNGS:  Clear. HEART:  Regular rhythm. ABDOMEN:  Soft, gravid, nontender. PELVIC:  Deferred. EXTREMITIES:  No cords. NEUROLOGIC:  Nonfocal. SKIN:  Intact.  Maternal fetal medicine ultrasound reveals a breech-presenting fetus at 369 g.  Fetal heart tones 156 beats per minute.  CBC is pending.  Neonatal NICU consult done.  IMPRESSION: 1.  Previable preterm rupture of membranes. 2.  IUGR with lagging approximately 2 weeks. 3.  Normal cell-free fetal DNA.  PLAN:  Admit.  We will admit the PPROM IV and p.o. antibiotics per protocol.  NICU consult pending.  NICU consult will make a decision in conjunction with MFM for  inpatient hospitalization for long-term versus short term.  We will monitor signs and  symptoms of chorio.  Will not deliver for fetal indications at this time.  Check fetal heart tones q. shift.  Risks and benefits discussed.  Risks of breech delivery with fetal entrapment have been discussed.  The patient acknowledges and will proceed  with care plan as outlined at this time.  LN/NUANCE  D:03/02/2019 T:03/02/2019 JOB:[id number]

## 2019-03-02 NOTE — Progress Notes (Signed)
Patient states she was seen in  MAU 2/28 and was confirmed with PPROM. States she has continued to leak fluid. States she started noting a small amount of blood when she wipes this morning. States she is not feeling fetal movement. States she was instr'd to keep this appt today.

## 2019-03-02 NOTE — Consult Note (Signed)
Neonatology Consult to Antenatal Patient:  I was asked by Dr. Ronita Hipps to see this patient in order to provide antenatal counseling due to premature ROM and [redacted] weeks GA.  Ann Williams was admitted today at 58 0/[redacted] weeks GA. Pregnancy has been complicated by oligohydramnios and IUGR. She had SROM at about 2100 on 2/28 and presents today for admission. Ultrasound shows EFW of 389 grams, no fluid present, breech presentation. She is currently not having active labor. She is getting IV Ampicillin. A decision about betamethasone is pending. I recommended that, if the parents want resuscitation at 23 weeks, that betamethasone administration is likely to increase the baby's chances for survival.  I spoke with the patient, her husband, and baby's grandparents. I explained embryological development of the lungs to them, emphasizing the possibility of not being able to perform tracheal intubation in a baby of this size. Survival for infant's weighing less than 400 grams at birth is virtually nil. We discussed the worst case of delivery in the next 1-2 days, including usual DR management, possible respiratory complications and need for support, IV access, LOS, Mortality and Morbidity, and long term outcomes. We also talked about the improvement in survival and neurodevelopmental outcomes with each additional week of gestation, if she remains pregnant. They had several questions, which I answered. I offered a NICU tour to any interested family members and would be glad to come back if she has more questions later.  Thank you for asking me to see this patient.  Real Cons, MD Neonatologist  The total length of face-to-face or floor/unit time for this encounter was 30 minutes. Counseling and/or coordination of care was 25 minutes of the above.

## 2019-03-03 ENCOUNTER — Encounter (HOSPITAL_COMMUNITY): Payer: Self-pay | Admitting: *Deleted

## 2019-03-03 NOTE — Progress Notes (Signed)
Nurse called into room due to patient stating she is actively bleeding. After assessing white pad there was scant light pink mucous like discharge. No red blood. Pt states no pain or cramping. Will continue to watch patient.

## 2019-03-03 NOTE — Progress Notes (Signed)
Patient ID: Ann Williams, female   DOB: [birth year], 33 y.o.   MRN: [id number] HD#1 [redacted]w[redacted]d PPROM  S: NO complaints. No contraction. FM noted Occ LOF. Occ spotting. NO CP or SOB No abdominal pain, fever or chills  O: BP 105/71 (BP Location: Right Arm)   Pulse 70   Temp 98.1 F (36.7 C) (Oral)   Resp 18   Ht 5' (1.524 m)   Wt 77.6 kg   LMP 09/22/2018 (Exact Date)   SpO2 99%   BMI 33.40 kg/m   HEENT: nl Neck: supple Lungs: CTA CV: RRR ABD: Gravid, NT No CVAT EXT: neg cords Neuro: nonfocal Skin: intact  FHT 156  CBC    Component Value Date/Time   WBC 12.3 (H) 03/02/2019 1955   RBC 3.70 (L) 03/02/2019 1955   HGB 11.9 (L) 03/02/2019 1955   HCT 34.5 (L) 03/02/2019 1955   PLT 230 03/02/2019 1955   MCV 93.2 03/02/2019 1955   MCH 32.2 03/02/2019 1955   MCHC 34.5 03/02/2019 1955   RDW 12.2 03/02/2019 1955   A: PPROM at 23w 1d Breech IUGR with nl NIPS No s/s chorio  P: PPROM protocol BMZ discussed NICU consult done FHT q shift

## 2019-03-04 NOTE — Progress Notes (Signed)
Patient ID: Ann Williams, female   DOB: 01/27/1986, 33 y.o.   MRN: [id number] HD#2 [redacted]w[redacted]d PPROM  S: Had one episode of increased fluid leakage last night. No contraction. FM noted Occ LOF. Occ spotting. NO CP or SOB No abdominal pain, fever or chills  O: BP 127/82 (BP Location: Right Arm)   Pulse 76   Temp 98 F (36.7 C) (Oral)   Resp 18   Ht 5' (1.524 m)   Wt 77.6 kg   LMP 09/22/2018 (Exact Date)   SpO2 100%   BMI 33.40 kg/m   HEENT: nl Neck: supple Lungs: CTA CV: RRR ABD: Gravid, NT No CVAT EXT: neg cords Neuro: nonfocal Skin: intact  FHT 148  CBC    Component Value Date/Time   WBC 12.3 (H) 03/02/2019 1955   RBC 3.70 (L) 03/02/2019 1955   HGB 11.9 (L) 03/02/2019 1955   HCT 34.5 (L) 03/02/2019 1955   PLT 230 03/02/2019 1955   MCV 93.2 03/02/2019 1955   MCH 32.2 03/02/2019 1955   MCHC 34.5 03/02/2019 1955   RDW 12.2 03/02/2019 1955   A: PPROM at 23w 2d Breech IUGR with nl NIPS No s/s chorio  P: PPROM antibiotic protocol BMZ discussed NICU consult done FHT q shift Discussed management with MFM No csection at this time for fetal indications. Offered outpt management at this time Pt and husband will consider options.

## 2019-03-05 LAB — TYPE AND SCREEN
ABO/RH(D): AB POS
ANTIBODY SCREEN: NEGATIVE

## 2019-03-05 NOTE — Progress Notes (Signed)
Teaching complete 

## 2019-03-05 NOTE — Progress Notes (Signed)
Prescriptions  Sent from office and followup  Appointment made  From office

## 2019-03-05 NOTE — Progress Notes (Signed)
Patient ID: Ann Williams, female   DOB: 1986-03-11, 33 y.o.   MRN: [id number] HD#3 [redacted]w[redacted]d PPROM  S: Had one episode of increased fluid leakage last night. No contraction. FM noted Occ LOF. Occ spotting. NO CP or SOB No abdominal pain, fever or chills Wants to dc home today  O: BP 114/71 (BP Location: Right Arm)   Pulse (!) 58   Temp 98.1 F (36.7 C) (Oral)   Resp 17   Ht 5' (1.524 m)   Wt 77.6 kg   LMP 09/22/2018 (Exact Date)   SpO2 100%   BMI 33.40 kg/m   HEENT: nl Neck: supple Lungs: CTA CV: RRR ABD: Gravid, NT No CVAT EXT: neg cords Neuro: nonfocal Skin: intact  FHT 148  CBC    Component Value Date/Time   WBC 12.3 (H) 03/02/2019 1955   RBC 3.70 (L) 03/02/2019 1955   HGB 11.9 (L) 03/02/2019 1955   HCT 34.5 (L) 03/02/2019 1955   PLT 230 03/02/2019 1955   MCV 93.2 03/02/2019 1955   MCH 32.2 03/02/2019 1955   MCHC 34.5 03/02/2019 1955   RDW 12.2 03/02/2019 1955   A: PPROM at 23w 3d Breech IUGR with nl NIPS No s/s chorio  P: PPROM antibiotic protocol BMZ discussed NICU consult done FHT q shift Discussed management with MFM No csection at this time for fetal indications. Offered outpt management at this time DC home today PO abx Fu office one week BMZ next week Dondra Spry with MFM 25 wks.

## 2019-03-08 NOTE — Discharge Summary (Signed)
NAMECHRISLYN, Ann Williams MEDICAL RECORD [id number] ACCOUNT [id number] DATE OF BIRTH:March 14, 1986 FACILITY: [facility] LOCATION: [facility] PHYSICIAN:Leoma Folds J. Ronita Hipps, MD  DISCHARGE SUMMARY  DATE OF DISCHARGE:  03/05/2019  ADMITTING DIAGNOSES:  Preterm premature rupture of membranes at 23 weeks.  DISCHARGE DIAGNOSES:  Previable preterm rupture of membranes with growth restriction.  HOSPITAL COURSE:  The patient admitted for preterm premature rupture of membranes.  During the course of hospitalization, she received the PPROM protocol for antibiotics and showed no signs and symptoms of chorioamnionitis, continued to leak fluid.   Discussion with MFM due to fetal viability, but with IUGR, was for nonrescue measures at this gestational age.  The patient had a NICU consult.  The patient and husband concurred with plan for outpatient management.  She will return to the office in 1  week at 24 weeks for betamethasone.  She will return to MFM for a repeat growth ultrasound at 25 weeks.  At that time, if there is still fetal viability, we will further discuss admission for fetal viability and expectant management until delivery.  DISCHARGE MEDICATIONS:  Prenatal vitamins and iron.  Discharge teaching done.  VN/NUANCE D:03/08/2019 T:03/08/2019 JOB:[id number]

## 2019-03-09 ENCOUNTER — Encounter (HOSPITAL_COMMUNITY): Payer: Self-pay

## 2019-03-10 ENCOUNTER — Other Ambulatory Visit (HOSPITAL_COMMUNITY): Payer: Self-pay | Admitting: Obstetrics and Gynecology

## 2019-03-10 DIAGNOSIS — Z3A24 24 weeks gestation of pregnancy: Secondary | ICD-10-CM

## 2019-03-10 DIAGNOSIS — Z362 Encounter for other antenatal screening follow-up: Secondary | ICD-10-CM

## 2019-03-13 ENCOUNTER — Other Ambulatory Visit: Payer: Self-pay

## 2019-03-13 ENCOUNTER — Inpatient Hospital Stay (HOSPITAL_COMMUNITY)
Admission: AD | Admit: 2019-03-13 | Discharge: 2019-03-13 | DRG: 806 | Disposition: A | Discharge status: Home / Self Care | Payer: BLUE CROSS/BLUE SHIELD | Attending: Obstetrics and Gynecology | Admitting: Obstetrics and Gynecology

## 2019-03-13 ENCOUNTER — Encounter (HOSPITAL_COMMUNITY): Payer: Self-pay

## 2019-03-13 DIAGNOSIS — O4102X0 Oligohydramnios, second trimester, not applicable or unspecified: Secondary | ICD-10-CM | POA: Diagnosis present

## 2019-03-13 DIAGNOSIS — O42912 Preterm premature rupture of membranes, unspecified as to length of time between rupture and onset of labor, second trimester: Secondary | ICD-10-CM | POA: Diagnosis present

## 2019-03-13 DIAGNOSIS — O321XX0 Maternal care for breech presentation, not applicable or unspecified: Secondary | ICD-10-CM | POA: Diagnosis present

## 2019-03-13 DIAGNOSIS — O365920 Maternal care for other known or suspected poor fetal growth, second trimester, not applicable or unspecified: Secondary | ICD-10-CM | POA: Diagnosis present

## 2019-03-13 DIAGNOSIS — Z3A24 24 weeks gestation of pregnancy: Secondary | ICD-10-CM | POA: Diagnosis not present

## 2019-03-13 DIAGNOSIS — O42112 Preterm premature rupture of membranes, onset of labor more than 24 hours following rupture, second trimester: Secondary | ICD-10-CM | POA: Diagnosis present

## 2019-03-13 LAB — CBC
HCT: 41.2 % (ref 36.0–46.0)
Hemoglobin: 13.8 g/dL (ref 12.0–15.0)
MCH: 31.4 pg (ref 26.0–34.0)
MCHC: 33.5 g/dL (ref 30.0–36.0)
MCV: 93.6 fL (ref 80.0–100.0)
Platelets: 371 10*3/uL (ref 150–400)
RBC: 4.4 MIL/uL (ref 3.87–5.11)
RDW: 12.5 % (ref 11.5–15.5)
WBC: 18.1 10*3/uL — ABNORMAL HIGH (ref 4.0–10.5)
nRBC: 0.1 % (ref 0.0–0.2)

## 2019-03-13 MED ORDER — ACETAMINOPHEN 325 MG PO TABS: 650.0000 mg | ORAL_TABLET | ORAL | Status: DC | PRN

## 2019-03-13 MED ORDER — IBUPROFEN 600 MG PO TABS
600.0000 mg | ORAL_TABLET | Freq: Four times a day (QID) | ORAL | Status: DC
  Administered 2019-03-13 (×2): 600 mg via ORAL
  Filled 2019-03-13 (×2): qty 1

## 2019-03-13 MED ORDER — BUSPIRONE HCL 15 MG PO TABS
7.5000 mg | ORAL_TABLET | Freq: Three times a day (TID) | ORAL | Status: DC | PRN
  Filled 2019-03-13: qty 1

## 2019-03-13 MED ORDER — OXYTOCIN 40 UNITS IN NORMAL SALINE INFUSION - SIMPLE MED
INTRAVENOUS | Status: AC
  Administered 2019-03-13: 08:00:00
  Filled 2019-03-13: qty 1000

## 2019-03-13 MED ORDER — OXYTOCIN 40 UNITS IN NORMAL SALINE INFUSION - SIMPLE MED: 2.5000 [IU]/h | INTRAVENOUS | Status: DC | PRN

## 2019-03-13 NOTE — Consult Note (Signed)
Code Apgar / Delivery Note    Our team responded to a Code Apgar call for a patient delivered by Dr. Harolyn Rutherford.  Ann Williams presented at [redacted]w[redacted]d to the MAU reporting the onset of cramping x several hours.  She has a history of PPROM x 2 weeks with initial hospital admission then monitoring from home.  Prenatal history complicated by IUGR, oligohydramnios, breech, PPROM. Precipitous delivery and infant delivered to the warmer flaccid and apneic.  Infant previable on exam with fused eyes, gelatanus skin, wide open fontanels, extremely low birth weight. There was the possible presence of 1-2 faint heartbeats although this was difficult to definitively discern.   Due to the history of IUGR, oligohydramnios in conjunction with a previable exam we elected not to perform resuscitative efforts. Infant wrapped in a blanket and held by mother with father at the bedside.    Higinio Roger, DO  Neonatologist

## 2019-03-13 NOTE — MAU Provider Note (Signed)
Chief Complaint:  Abdominal Pain   None     HPI: Anyra Kaufman is a 33 y.o. G2P0010 at [redacted]w[redacted]d with PPROM at 42 weeks and IUGR who presents to maternity admissions reporting severe abdominal pain and pelvic pressure increasing in intensity over the last 2 hours.     HPI  Past Medical History: Past Medical History:  Diagnosis Date  . Depression   . Medical history non-contributory     Past obstetric history: OB History  Gravida Para Term Preterm AB Living  2       1 0  SAB TAB Ectopic Multiple Live Births  1            # Outcome Date GA Lbr Len/2nd Weight Sex Delivery Anes PTL Lv  2 Current           1 SAB 2016            Past Surgical History: Past Surgical History:  Procedure Laterality Date  . NO PAST SURGERIES      Family History: Family History  Problem Relation Age of Onset  . Hypertension Mother   . Hypertension Maternal Grandmother   . Breast cancer Neg Hx   . Ovarian cancer Neg Hx   . Colon cancer Neg Hx   . Diabetes Neg Hx     Social History: Social History   Tobacco Use  . Smoking status: Never Smoker  . Smokeless tobacco: Never Used  Substance Use Topics  . Alcohol use: Never    Frequency: Never  . Drug use: Never    Allergies: No Known Allergies  Meds:  Medications Prior to Admission  Medication Sig Dispense Refill Last Dose  . Prenatal Vit-Fe Fumarate-FA (PRENATAL MULTIVITAMIN) TABS tablet Take 1 tablet by mouth daily at 12 noon.   03/02/2019 at Unknown time    ROS:  Review of Systems  Constitutional: Negative for chills, fatigue and fever.  Eyes: Negative for visual disturbance.  Respiratory: Negative for shortness of breath.   Cardiovascular: Negative for chest pain.  Gastrointestinal: Positive for abdominal pain. Negative for nausea and vomiting.  Genitourinary: Positive for pelvic pain. Negative for difficulty urinating, dysuria, flank pain, vaginal bleeding, vaginal discharge and vaginal pain.  Musculoskeletal: Positive for  back pain.  Neurological: Negative for dizziness and headaches.  Psychiatric/Behavioral: Negative.      I have reviewed patient's Past Medical Hx, Surgical Hx, Family Hx, Social Hx, medications and allergies.   Physical Exam   Patient Vitals for the past 24 hrs:  BP Pulse  03/13/19 0757 137/89 90   Constitutional: Well-developed, well-nourished female in no acute distress.  Cardiovascular: normal rate Respiratory: normal effort GI: Abd soft, non-tender, gravid appropriate for gestational age.  MS: Extremities nontender, no edema, normal ROM Neurologic: Alert and oriented x 4.  GU: Neg CVAT.   Dilation: 10 Exam by:: leftwich kirby cnm Infant partially delivered with breech position noted, fetal parts through cervix  FHT:  Unable to obtain FHT with EFM or doppler.  Bedside US without clear evidence of FHR.    Labs: No results found for this or any previous visit (from the past 24 hour(s)). --/--/AB POS (03/05 1610)  Imaging:  Pt informed that the ultrasound is considered a limited OB ultrasound and is not intended to be a complete ultrasound exam.  Patient also informed that the ultrasound is not being completed with the intent of assessing for fetal or placental anomalies or any pelvic abnormalities.  Explained that the purpose of today's  ultrasound is to assess for  presentation and viability.  Patient acknowledges the purpose of the exam and the limitations of the study.    FHR not visible on [place]    MAU Course/MDM: No orders of the defined types were placed in this encounter.   Meds ordered this encounter  Medications  . oxytocin 40 units in NS 1000 mL 40 units/1000 mL infusion    Druebbisch, Elmyra Ricks  : cabinet override     Called to room by RN upon pt arrival with severe pain.  NICU team paged and Dr Harolyn Rutherford paged, both arrived to room quickly.  Infant delivered in complete breech position by Dr Harolyn Rutherford. Cord clamped and cut and infant to warmer by NICU.  Artelia Laroche, CNM, arrived prior to delivery of placenta.  Neonatologist presented small fetal size and inability to resuscitate to pt and her husband who mourned appropriately.    Artelia Laroche, CNM, assumed care of pt.  Fatima Blank Certified Nurse-Midwife 03/13/2019 8:19 AM

## 2019-03-13 NOTE — MAU Note (Addendum)
Female infant born @ 49, NICU team @ bedside, resuscitation not performed.

## 2019-03-13 NOTE — Progress Notes (Signed)
Dr Ronita Hipps states that he will notify Dr Garwin Brothers about pt

## 2019-03-13 NOTE — Progress Notes (Signed)
Offered bereavement support to family throughout the day.  They are very tearful and still somewhat in shock.  I normalized this and let them know that we will continue to be here to support them even after they discharge.  They have moved to [place] 8 months ago and the rest of their family is in [place] and [place].  They plan to have their baby, Frankey Poot, cremated and they also plan to have a mass for him.  They are good friends with one of the priests at [facility] and have good support from their community.  [place], bcc Pager, [phone] 3:04 PM

## 2019-03-13 NOTE — H&P (Addendum)
HISTORY AND PHYSICAL  DATE OF ADMISSION:  03/13/2019  ADMISSION DIAGNOSIS:  Previable preterm ruptured membranes at 24 weeks and 4 days, now in labor.  HISTORY OF PRESENT ILLNESS:  She is a 33 year old white female G2 P1 with known IUGR and oligohydramnios during the course of the pregnancy, normal cell free DNA who presents now at 24+ weeks for labor and delivery.  She has a history of leakage of fluid with positive fern and Nitrazine at 22 weeks and 5 days.   ALLERGIES:  She has no known drug allergies.  MEDICATIONS:  Prenatal vitamins and baby aspirin.  SOCIAL HISTORY:  She is a nonsmoker, nondrinker.  She denies domestic physical violence.  PAST MEDICAL HISTORY:  She has a history of 1 previous miscarriage in 2016.  PHYSICAL EXAMINATION: GENERAL:  She is a well-developed, well-nourished white female in moderate acute distress. HEENT:  Normal. NECK:  Supple, full range of motion. LUNGS:  Clear. HEART:  Regular rhythm. ABDOMEN:  Soft, gravid, nontender. PELVIC:  breech fetus in vagina EXTREMITIES:  No cords. NEUROLOGIC:  Nonfocal. SKIN:  Intact.  See Nicu note   IMPRESSION: 1.  Previable preterm rupture of membranes. 2.  IUGR with lagging approximately 2 weeks. 3.  Normal cell-free fetal DNA. 4.   Active labor and delivery  Plan: Admit Observe for s/s endometritis Bereavement counseling                          Routing History

## 2019-03-13 NOTE — MAU Note (Signed)
Ann Williams is a 33 y.o. at [redacted]w[redacted]d here in MAU reporting: states her water broke 2 weeks ago, has been being monitored from home. Started having cramping a few hours ago. No bleeding.  Onset of complaint: pain started a few hours ago  Pain score: 8/10

## 2019-03-13 NOTE — Progress Notes (Signed)
Grieving appropriately Minimal bleeding BP 139/83 (BP Location: Left Arm)   Pulse 66   Temp 98.4 F (36.9 C) (Oral)   Resp 18   LMP 09/22/2018 (Exact Date)   SpO2 100%   DC home Fu office 2w

## 2019-03-13 NOTE — Discharge Summary (Signed)
Obstetric Discharge Summary Reason for Admission: onset of labor Prenatal Procedures: none Intrapartum Procedures: spontaneous vaginal delivery and breech extraction Postpartum Procedures: none Complications-Operative and Postpartum: none Hemoglobin  Date Value Ref Range Status  03/13/2019 13.8 12.0 - 15.0 g/dL Final   HCT  Date Value Ref Range Status  03/13/2019 41.2 36.0 - 46.0 % Final    Physical Exam:  General: alert, cooperative and appears stated age 33: appropriate Uterine Fundus: firm Incision: healing well, no significant drainage DVT Evaluation: No evidence of DVT seen on physical exam.  Discharge Diagnoses: Spontaneous abortion  Discharge Information: Date: 03/13/2019 Activity: unrestricted- pelvic rest Diet: routine Medications: PNV and Ibuprofen Condition: stable Instructions: refer to practice specific booklet Discharge to: home Follow-up Information    [name], MD. Schedule an appointment as soon as possible for a visit in 2 week(s).   Specialty:  Obstetrics and Gynecology Why:  Call office to schedule appointment in two weeks. Contact information: [place] [address] [phone]           Newborn Data: Live born female  Birth Weight: 13.2 oz (375 g) APGAR: 1, 0  Newborn Delivery   Birth date/time:  03/13/2019 07:53:00 Delivery type:  Vaginal, Breech Breech:  Spontaneous     To morgue.  Marin Wisner J 03/13/2019, 6:45 PM

## 2019-03-16 ENCOUNTER — Ambulatory Visit (HOSPITAL_COMMUNITY): Payer: BLUE CROSS/BLUE SHIELD

## 2019-03-16 ENCOUNTER — Other Ambulatory Visit (HOSPITAL_COMMUNITY): Payer: BLUE CROSS/BLUE SHIELD

## 2019-03-18 ENCOUNTER — Ambulatory Visit (HOSPITAL_COMMUNITY): Payer: BLUE CROSS/BLUE SHIELD

## 2019-03-18 ENCOUNTER — Encounter (HOSPITAL_COMMUNITY): Payer: Self-pay

## 2019-03-18 ENCOUNTER — Other Ambulatory Visit (HOSPITAL_COMMUNITY): Payer: BLUE CROSS/BLUE SHIELD

## 2019-06-15 ENCOUNTER — Telehealth: Payer: Self-pay | Admitting: Oncology

## 2019-06-15 NOTE — Telephone Encounter (Signed)
Received a new hem referral from Dr. Ronita Hipps at [facility] for antiphospholipid syndrome. Pt has been cld and scheduled to see Dr. Alen Blew on 6/23 at [facility] to arrive 30 minutes early. Address and location to our facility has been provided to the pt.

## 2019-06-23 ENCOUNTER — Other Ambulatory Visit: Payer: Self-pay

## 2019-06-23 ENCOUNTER — Inpatient Hospital Stay: Payer: BC Managed Care – PPO | Attending: Oncology | Admitting: Oncology

## 2019-06-23 VITALS — BP 132/87 | HR 99 | Temp 99.8°F | Resp 18 | Ht 60.0 in | Wt 183.6 lb

## 2019-06-23 DIAGNOSIS — R760 Raised antibody titer: Secondary | ICD-10-CM | POA: Diagnosis not present

## 2019-06-23 DIAGNOSIS — N96 Recurrent pregnancy loss: Secondary | ICD-10-CM | POA: Diagnosis not present

## 2019-06-23 NOTE — Progress Notes (Signed)
Reason for the request: Antiphospholipid antibody syndrome  HPI: I was asked by Dr. Ronita Hipps  to evaluate Ann Williams for a presumed antiphospholipid antibody syndrome.  She is 33 year old woman native of [place] and has been living in this area for the last year.  She has no significant past medical history although she did have a early trimester miscarriage with her first pregnancy.  She was pregnant again in 2019 and her pregnancy was complicated with the initially heavy vaginal bleeding back in November 2019 which required intermittent evaluation and hospitalization.  He developed oligohydramnios during this pregnancy as well as IUGR.  She subsequently was hospitalized on March 02, 2023 preterm ruptured membranes.  On March 13, 2019 she went into labor and developed spontaneous abortion at 74 weeks.  A follow-up evaluation in June 2020 under the care of Dr. Ronita Hipps included a hypercoagulable work-up which should issue with anticardiolipin antibody to be normal, factor II mutation was not identified, she did have an elevated beta-2 glycoprotein IgG subtype at 34 with normal IgM.  Based on these findings she was referred to me for evaluation for potential antiphospholipid antibody syndrome.  Clinically, she feels well at this time without any complaints.  She is not desiring any immediate pregnancy at this time.  She denies any personal history of thrombosis including no DVT, PE or phlebitis.  She does not report any family history of recurrent thrombosis.  She has no family history of miscarriage or obstetrical complications.   She does not report any headaches, blurry vision, syncope or seizures. Does not report any fevers, chills or sweats.  Does not report any cough, wheezing or hemoptysis.  Does not report any chest pain, palpitation, orthopnea or leg edema.  Does not report any nausea, vomiting or abdominal pain.  Does not report any constipation or diarrhea.  Does not report any skeletal complaints.     Does not report frequency, urgency or hematuria.  Does not report any skin rashes or lesions. Does not report any heat or cold intolerance.  Does not report any lymphadenopathy or petechiae.  Does not report any anxiety or depression.  Remaining review of systems is negative.    Past Medical History:  Diagnosis Date  . Depression   . Medical history non-contributory   :  Past Surgical History:  Procedure Laterality Date  . NO PAST SURGERIES    :   Current Outpatient Medications:  .  Prenatal Vit-Fe Fumarate-FA (PRENATAL MULTIVITAMIN) TABS tablet, Take 1 tablet by mouth daily at 12 noon., Disp: , Rfl: :  No Known Allergies:  Family History  Problem Relation Age of Onset  . Hypertension Mother   . Hypertension Maternal Grandmother   . Breast cancer Neg Hx   . Ovarian cancer Neg Hx   . Colon cancer Neg Hx   . Diabetes Neg Hx   :  Social History   Socioeconomic History  . Marital status: Married    Spouse name: Not on file  . Number of children: Not on file  . Years of education: Not on file  . Highest education level: Not on file  Occupational History  . Not on file  Social Needs  . Financial resource strain: Not on file  . Food insecurity    Worry: Not on file    Inability: Not on file  . Transportation needs    Medical: Not on file    Non-medical: Not on file  Tobacco Use  . Smoking status: Never Smoker  .  Smokeless tobacco: Never Used  Substance and Sexual Activity  . Alcohol use: Never    Frequency: Never  . Drug use: Never  . Sexual activity: Yes  Lifestyle  . Physical activity    Days per week: 2 days    Minutes per session: 40 min  . Stress: Not at all  Relationships  . Social Herbalist on phone: Not on file    Gets together: Not on file    Attends religious service: Not on file    Active member of club or organization: Not on file    Attends meetings of clubs or organizations: Not on file    Relationship status: Not on file  .  Intimate partner violence    Fear of current or ex partner: Not on file    Emotionally abused: Not on file    Physically abused: Not on file    Forced sexual activity: Not on file  Other Topics Concern  . Not on file  Social History Narrative  . Not on file  :  Pertinent items are noted in HPI.  Exam: Blood pressure 132/87, pulse 99, temperature 99.8 F (37.7 C), temperature source Oral, resp. rate 18, height 5' (1.524 m), weight 183 lb 9.6 oz (83.3 kg), SpO2 99 %, unknown if currently breastfeeding.  ECOG 0 General appearance: alert and cooperative appeared without distress. Head: atraumatic without any abnormalities. Eyes: conjunctivae/corneas clear. PERRL.  Sclera anicteric. Throat: lips, mucosa, and tongue normal; without oral thrush or ulcers. Resp: clear to auscultation bilaterally without rhonchi, wheezes or dullness to percussion. Cardio: regular rate and rhythm, S1, S2 normal, no murmur, click, rub or gallop GI: soft, non-tender; bowel sounds normal; no masses,  no organomegaly Skin: Skin color, texture, turgor normal. No rashes or lesions Lymph nodes: Cervical, supraclavicular, and axillary nodes normal. Neurologic: Grossly normal without any motor, sensory or deep tendon reflexes. Musculoskeletal: No joint deformity or effusion.    Assessment and plan:  33 year old woman with  1.  Elevated IgG anticardiolipin antibody detected in the setting of spontaneous abortion after 24 weeks and rupture membranes.  She also had a early trimester miscarriage that occurred previously.  The natural course of antiphospholipid antibody syndrome was discussed.  Obstetrical complications versus thrombotic complications were reviewed today.  She does not appear to have any personal history of thrombosis in the past.  She has had 2 pregnancies without any personal history of DVT, PE or phlebitis.  She is also been on oral contraceptives in the past without any issues previously.  She does  not appear to be her personal risk of developing recurrent thrombosis.  From an obstetrical antiphospholipid antibody syndrome, it is certainly possible that she is manifesting with obstetrical complications given her presentation.  She had an early trimester miscarriage and had a spontaneous abortion as outlined above.  From a management standpoint, I have recommended repeating her antiphospholipid antibody syndrome panel in 12 weeks to confirm these findings.  If if she is indeed continues to have a positive anticardiolipin antibody than it is reasonable to assume that she has at risk of developing future obstetrical complications.  If she desires pregnancy in the future, I have recommended potentially using anticoagulation with Lovenox in addition to close monitoring by maternal-fetal medicine and possibly obtaining an opinion from a tertiary care center regarding treating this condition.    All her questions were answered today regarding this condition and why she might have it.  2.  Follow-up: We  will be in 3 months to repeat laboratory testing and further discussion.   60  minutes was spent with the patient face-to-face today.  More than 50% of time was spent on reviewing her laboratory data, the natural course of her disease, potential complications and answering questions regarding future plan of care..      Thank you for the referral.  A copy of this consult has been forwarded to the requesting physician.

## 2019-06-24 ENCOUNTER — Telehealth: Payer: Self-pay | Admitting: Oncology

## 2019-06-24 NOTE — Telephone Encounter (Signed)
Called and left msg for patient. Mailed printout  °

## 2019-09-03 ENCOUNTER — Inpatient Hospital Stay: Payer: BC Managed Care – PPO | Attending: Oncology

## 2019-09-03 ENCOUNTER — Other Ambulatory Visit: Payer: Self-pay

## 2019-09-03 DIAGNOSIS — O039 Complete or unspecified spontaneous abortion without complication: Secondary | ICD-10-CM | POA: Diagnosis not present

## 2019-09-03 DIAGNOSIS — R760 Raised antibody titer: Secondary | ICD-10-CM

## 2019-09-04 LAB — CARDIOLIPIN ANTIBODIES, IGG, IGM, IGA
Anticardiolipin IgA: <9 APL U/mL (ref 0–11)
Anticardiolipin IgG: <9 GPL U/mL (ref 0–14)
Anticardiolipin IgM: 9 MPL U/mL (ref 0–12)

## 2019-09-04 LAB — LUPUS ANTICOAGULANT PANEL
DRVVT: 38.1 s (ref 0.0–47.0)
PTT Lupus Anticoagulant: 26.1 s (ref 0.0–51.9)

## 2019-09-04 LAB — BETA-2-GLYCOPROTEIN I ABS, IGG/M/A
Beta-2 Glyco I IgG: 40 GPI IgG units — ABNORMAL HIGH (ref 0–20)
Beta-2-Glycoprotein I IgA: 12 GPI IgA units (ref 0–25)
Beta-2-Glycoprotein I IgM: <9 GPI IgM units (ref 0–32)

## 2019-09-11 ENCOUNTER — Telehealth: Payer: Self-pay

## 2019-09-11 NOTE — Telephone Encounter (Signed)
Contacted patient and made aware that Dr. Alen Blew will discuss lab results on the 9/25 scheduled visit. Patient stated that she was unaware that she had a follow up appt scheduled. She stated that she was appreciative of the phone call and clarification of the appointment. No other questions or concerns.

## 2019-09-11 NOTE — Telephone Encounter (Signed)
-----   Message from Wyatt Portela, MD sent at 09/11/2019 11:36 AM EDT ----- Regarding: RE: request for lab results from 9/3. thanks She has follow up 9/25 to discuss. Labs are unchanged from previous and we can discuss further. Thanks ----- Message ----- From: Scot Dock, RN Sent: 09/11/2019  10:41 AM EDT To: Wyatt Portela, MD Subject: request for lab results from 9/3. thanks

## 2019-09-25 ENCOUNTER — Other Ambulatory Visit: Payer: Self-pay

## 2019-09-25 ENCOUNTER — Inpatient Hospital Stay (HOSPITAL_BASED_OUTPATIENT_CLINIC_OR_DEPARTMENT_OTHER): Payer: BC Managed Care – PPO | Admitting: Oncology

## 2019-09-25 VITALS — BP 110/64 | HR 85 | Temp 97.8°F | Resp 18 | Ht 60.0 in | Wt 186.7 lb

## 2019-09-25 DIAGNOSIS — R760 Raised antibody titer: Secondary | ICD-10-CM

## 2019-09-25 DIAGNOSIS — O039 Complete or unspecified spontaneous abortion without complication: Secondary | ICD-10-CM | POA: Diagnosis not present

## 2019-09-25 NOTE — Progress Notes (Signed)
Hematology and Oncology Follow Up Visit  Ann Williams [id number] [birth year] 33 y.o. 09/25/2019 10:00 AM Ann Williams, MDTaavon, Richard, MD   Principle Diagnosis: 33 year old woman with elevated beta-2 glycoprotein IgG antibody in the setting of spontaneous abortion correction the membrane diagnosed in 2019.   Current therapy: Active surveillance.  Interim History: [name] presents today for a follow-up visit.  Since her last visit, she reports no changes in her health.  He denies any abdominal pain discomfort.  She denies any hospitalization or illnesses.  She denies any thrombosis or bleeding episodes.  She continues to be active and attends to activities of daily living.  she denied any alteration mental status, neuropathy, confusion or dizziness.  Denies any headaches or lethargy.  Denies any night sweats, weight loss or changes in appetite.  Denied orthopnea, dyspnea on exertion or chest discomfort.  Denies shortness of breath, difficulty breathing hemoptysis or cough.  Denies any abdominal distention, nausea, early satiety or dyspepsia.  Denies any hematuria, frequency, dysuria or nocturia.  Denies any skin irritation, dryness or rash.  Denies any ecchymosis or petechiae.  Denies any lymphadenopathy or clotting.  Denies any heat or cold intolerance.  Denies any anxiety or depression.  Remaining review of system is negative.       Medications: I have reviewed the patient's current medications.  Current Outpatient Medications  Medication Sig Dispense Refill  . Prenatal Vit-Fe Fumarate-FA (PRENATAL MULTIVITAMIN) TABS tablet Take 1 tablet by mouth daily at 12 noon.     No current facility-administered medications for this visit.      Allergies: No Known Allergies  Past Medical History, Surgical history, Social history, and Family History were reviewed and updated.    Physical Exam: Blood pressure 110/64, pulse 85, temperature 97.8 F (36.6 C), temperature source Oral,  resp. rate 18, height 5' (1.524 m), weight 186 lb 11.2 oz (84.7 kg), SpO2 100 %, unknown if currently breastfeeding. ECOG: 0    Lab Results: Lab Results  Component Value Date   WBC 18.1 (H) 03/13/2019   HGB 13.8 03/13/2019   HCT 41.2 03/13/2019   MCV 93.6 03/13/2019   PLT 371 03/13/2019     Chemistry   No results found for: NA, K, CL, CO2, BUN, CREATININE, GLU No results found for: CALCIUM, ALKPHOS, AST, ALT, BILITOT   Results for Ann Williams (MRN [id number]) as of 09/25/2019 09:34  Ref. Range 09/03/2019 11:38  Anticardiolipin Ab,IgA,Qn Latest Ref Range: 0 - 11 APL U/mL <9  Anticardiolipin Ab,IgG,Qn Latest Ref Range: 0 - 14 GPL U/mL <9  Anticardiolipin Ab,IgM,Qn Latest Ref Range: 0 - 12 MPL U/mL 9  PTT Lupus Anticoagulant Latest Ref Range: 0.0 - 51.9 sec 26.1  DRVVT Latest Ref Range: 0.0 - 47.0 sec 38.1  Lupus Anticoag Interp Unknown Comment:  Beta-2 Glycoprotein I Ab, IgG Latest Ref Range: 0 - 20 GPI IgG units 40 (H)  Beta-2-Glycoprotein I IgA Latest Ref Range: 0 - 25 GPI IgA units 12  Beta-2-Glycoprotein I IgM Latest Ref Range: 0 - 32 GPI IgM units <9      Impression and Plan:   33 year old woman with:  1.  Elevated beta-2 glycoprotein IgG in the setting of spontaneous abortion 2019.  In June 2020 beta-2 glycoprotein IgG antibody was 34 with otherwise normal antiphospholipid panel.  Repeat laboratory testing on 09/03/2019 was personally reviewed and discussed with the patient.  Her anticardiolipin antibody all within normal range.  Lupus anticoagulant was not detected.  Beta-2 glycoprotein IgG level was elevated at  40.  Her IgA and IgM antibody are normal.  The natural course of these findings as well as implication regarding pregnancy and postpartum was discussed.  Given her recent miscarriage is likely that antiphospholipid antibody is contributing to that and treatment to during future pregnancy and postpartum would be necessary.  I have discussed the role of low  molecular weight heparin with daily treatment throughout her pregnancy and extending for 6 weeks postpartum was reviewed.  Complications include bleeding, injection related complications were reiterated.  At this time, she does not require any anticoagulation given the fact that she has not had any personal thrombosis.  I would recommend low molecular weight heparin during pregnancy with unfractionated heparin to be closer to her due date to facilitate epidural anesthesia.  All her questions were answered today to her satisfaction.  2.  Follow-up: Will be determined pending her desire for future pregnancy.    25  minutes was spent with the patient face-to-face today.  More than 50% of time was spent reviewing her laboratory data, the ramification of these findings, treatment options and complications of therapy.    Zola Button, MD 9/25/202010:00 AM

## 2019-09-28 ENCOUNTER — Telehealth: Payer: Self-pay | Admitting: Oncology

## 2019-09-28 NOTE — Telephone Encounter (Signed)
No los nor sch message per 9/25.

## 2019-10-26 ENCOUNTER — Encounter (HOSPITAL_COMMUNITY): Payer: Self-pay | Admitting: *Deleted

## 2019-10-26 ENCOUNTER — Inpatient Hospital Stay (HOSPITAL_COMMUNITY)
Admission: RE | Admit: 2019-10-26 | Discharge: 2019-10-26 | DRG: 881 | Disposition: A | Discharge status: Home / Self Care | Payer: BC Managed Care – PPO | Attending: Psychiatry | Admitting: Psychiatry

## 2019-10-26 ENCOUNTER — Other Ambulatory Visit: Payer: Self-pay

## 2019-10-26 DIAGNOSIS — Z7289 Other problems related to lifestyle: Secondary | ICD-10-CM

## 2019-10-26 DIAGNOSIS — Z20828 Contact with and (suspected) exposure to other viral communicable diseases: Secondary | ICD-10-CM | POA: Diagnosis present

## 2019-10-26 DIAGNOSIS — F329 Major depressive disorder, single episode, unspecified: Principal | ICD-10-CM | POA: Diagnosis present

## 2019-10-26 DIAGNOSIS — Z915 Personal history of self-harm: Secondary | ICD-10-CM | POA: Diagnosis not present

## 2019-10-26 DIAGNOSIS — F332 Major depressive disorder, recurrent severe without psychotic features: Secondary | ICD-10-CM | POA: Diagnosis not present

## 2019-10-26 LAB — RAPID URINE DRUG SCREEN, HOSP PERFORMED
Amphetamines: NOT DETECTED
Barbiturates: NOT DETECTED
Benzodiazepines: NOT DETECTED
Cocaine: NOT DETECTED
Opiates: NOT DETECTED
Tetrahydrocannabinol: NOT DETECTED

## 2019-10-26 LAB — PREGNANCY, URINE: Preg Test, Ur: NEGATIVE

## 2019-10-26 LAB — SARS CORONAVIRUS 2 BY RT PCR (HOSPITAL ORDER, PERFORMED IN ~~LOC~~ HOSPITAL LAB): SARS Coronavirus 2: NEGATIVE

## 2019-10-26 MED ORDER — ENBRACE HR PO CAPS: 1.0000 | ORAL_CAPSULE | Freq: Every day | ORAL | 11 refills | Status: AC

## 2019-10-26 MED ORDER — LORAZEPAM 0.5 MG PO TABS: 1.0000 mg | ORAL_TABLET | Freq: Three times a day (TID) | ORAL | 0 refills | Status: AC | PRN

## 2019-10-26 MED ORDER — ESCITALOPRAM OXALATE 20 MG PO TABS: 20.0000 mg | ORAL_TABLET | Freq: Every day | ORAL | 1 refills | Status: AC

## 2019-10-26 MED ORDER — MODAFINIL 200 MG PO TABS: 200.0000 mg | ORAL_TABLET | Freq: Every day | ORAL | 1 refills | Status: AC

## 2019-10-26 NOTE — Discharge Summary (Signed)
Physician Discharge Summary Note  Patient:  Ann Williams is an 33 y.o., female MRN:  [id number] DOB:  12-18-1986 Patient phone:  [phone] (home)  Patient address:  [place] [address],  Total Time spent with patient: 45 minutes  Date of Admission:  10/26/2019 Date of Discharge: 10/26/2019  Reason for Admission:    Patient was seen in the observation ward, she presented due to depressive symptoms-superficial scratch on her leg so forth her main stressors include loss of her premature infant, of course this was the second time it happened.  According the assessment team   Patient reports she had a miscarriage 6 months ago and reports this as her second miscarriage. Patient reports it was recommended by her therapist that she comes to [facility] for evaluation. Reports her grandfather passed away last week and she returned from his funeral yesterday. Reports her father passed away two years ago. Reports a strained relationship with her husband reporting yesterday, she found him looking at pictures of another female in his phone states," this was the tip of the iceberg."  She endorses worsening depression and anxiety and although she denies any suicidal thoughts she states,: I have have thoughts that I just want to be with my son" whom she recently miscarriaged. She reports a history of cutting behaviors to feel pain and stress and report she started cutting again after many years once last week and once today (sueprficial). She states,: I cant make a promise that I will not cut myself again because everything I have tried to do, is not working." She denies homicidal ideations, psychosis, or history of suicide attempts. She reports she was started on Zoloft by her OBGYN and reports after the medication was increased, it caused her to feel more anxious so she stopped it last Sunday. She denies other psychotropic medications used int he past. She denies any previous psychiatric  hospitalizations. Denies history of physical, sexual or emotional abuse. Reports smoking marijuana daily although denies other substance abuse or use. Reports family history of psychiatric illness as mother- depression and anxiety and father-bipolar and alcohol abuse. She reports limited social support from husband. Reports mother and other social supports lives in [place].  Principal Problem: Severity of depression without suicidal thoughts Discharge Diagnoses: Active Problems:   MDD (major depressive disorder)   Past Psychiatric History: History of sertraline trial which she thought may have increased panic  Past Medical History:  Past Medical History:  Diagnosis Date  . Depression   . Medical history non-contributory     Past Surgical History:  Procedure Laterality Date  . NO PAST SURGERIES     Family History:  Family History  Problem Relation Age of Onset  . Hypertension Mother   . Hypertension Maternal Grandmother   . Breast cancer Neg Hx   . Ovarian cancer Neg Hx   . Colon cancer Neg Hx   . Diabetes Neg Hx    Family Psychiatric  History: No new data Social History:  Social History   Substance and Sexual Activity  Alcohol Use Never  . Frequency: Never     Social History   Substance and Sexual Activity  Drug Use Never    Social History   Socioeconomic History  . Marital status: Married    Spouse name: Not on file  . Number of children: Not on file  . Years of education: Not on file  . Highest education level: Not on file  Occupational History  .  Not on file  Social Needs  . Financial resource strain: Not on file  . Food insecurity    Worry: Not on file    Inability: Not on file  . Transportation needs    Medical: Not on file    Non-medical: Not on file  Tobacco Use  . Smoking status: Never Smoker  . Smokeless tobacco: Never Used  Substance and Sexual Activity  . Alcohol use: Never    Frequency: Never  . Drug use: Never  . Sexual activity: Yes   Lifestyle  . Physical activity    Days per week: 2 days    Minutes per session: 40 min  . Stress: Not at all  Relationships  . Social Herbalist on phone: Not on file    Gets together: Not on file    Attends religious service: Not on file    Active member of club or organization: Not on file    Attends meetings of clubs or organizations: Not on file    Relationship status: Not on file  Other Topics Concern  . Not on file  Social History Narrative  . Not on file    Hospital Course:   Patient was seen in the observation unit as she had requested discharge.  Initially the plan was to admit her but she insisted she was no danger to self or others, could contract fully for safety and on mental status exam was alert oriented cooperative pleasant without thoughts of harming herself no obsessive-compulsive symptoms no manic symptoms no psychosis.  She did have a superficial scratch but nothing life-threatening at all and she denied any intent to harm her self upon discharge and she stated she would feel worse if she came in the hospital but simply helped for med adjustments which we accomplished we discussed a new antidepressant she will be on Lexapro we will give her lorazepam for her panic we will give her modafinil for her fatigue and we will give her EnBrace HR which is a high risk prenatal which will also help with depression  Physical Findings: AIMS: Facial and Oral Movements Muscles of Facial Expression: None, normal Lips and Perioral Area: None, normal Jaw: None, normal Tongue: None, normal,Extremity Movements Upper (arms, wrists, hands, fingers): None, normal Lower (legs, knees, ankles, toes): None, normal, Trunk Movements Neck, shoulders, hips: None, normal, Overall Severity Severity of abnormal movements (highest score from questions above): None, normal Incapacitation due to abnormal movements: None, normal Patient's awareness of abnormal movements (rate only  patient's report): No Awareness, Dental Status Current problems with teeth and/or dentures?: No Does patient usually wear dentures?: No  CIWA:    COWS:     Musculoskeletal: Strength & Muscle Tone: nl Gait & Station: normal Patient leans: N/A  Psychiatric Specialty Exam: Physical Exam  Nursing note and vitals reviewed. Constitutional: She appears well-developed and well-nourished.    Review of Systems  Constitutional: Positive for malaise/fatigue.  Eyes: Negative.   Cardiovascular: Negative.   Gastrointestinal: Negative.   Genitourinary: Negative.   Neurological: Negative.   Endo/Heme/Allergies: Negative.   Psychiatric/Behavioral: Positive for depression. The patient is nervous/anxious.     Blood pressure (!) 147/94, pulse 93, temperature 98.3 F (36.8 C), temperature source Oral, resp. rate 20, SpO2 99 %, unknown if currently breastfeeding.There is no height or weight on file to calculate BMI.  General Appearance: Casual  Eye Contact:  Good  Speech:  Clear and Coherent  Volume:  Normal  Mood:  Dysphoric  Affect:  Full Range  Thought Process:  Coherent, Linear and Descriptions of Associations: Circumstantial  Orientation:  Full (Time, Place, and Person)  Thought Content:  Rumination  Suicidal Thoughts:  No  Homicidal Thoughts:  No  Memory:  Immediate;   Good Recent;   Good Remote;   Good  Judgement:  Good  Insight:  Good  Psychomotor Activity:  Normal  Concentration:  Concentration: Good and Attention Span: Good  Recall:  Good  Fund of Knowledge:  Good  Language:  Good  Akathisia:  Negative  Handed:  Right  AIMS (if indicated):     Assets:  Communication Skills Desire for Improvement Financial Resources/Insurance Housing Leisure Time Physical Health Resilience  ADL's:  Intact  Cognition:  WNL  Sleep:           Has this patient used any form of tobacco in the last 30 days? (Cigarettes, Smokeless Tobacco, Cigars, and/or Pipes) Yes, No  Blood Alcohol  level:  No results found for: [facility]  Metabolic Disorder Labs:  No results found for: HGBA1C, MPG No results found for: PROLACTIN No results found for: CHOL, TRIG, HDL, CHOLHDL, VLDL, LDLCALC  See Psychiatric Specialty Exam and Suicide Risk Assessment completed by Attending Physician prior to discharge.  Discharge destination:  Home  Is patient on multiple antipsychotic therapies at discharge:  No   Has Patient had three or more failed trials of antipsychotic monotherapy by history:  No  Recommended Plan for Multiple Antipsychotic Therapies: NA   Allergies as of 10/26/2019   No Known Allergies     Medication List    TAKE these medications     Indication  EnBrace HR Caps Take 1 capsule by mouth daily at 12 noon. May get from [email or web address] if not covered  Indication: Deficiency of Folic Acid   escitalopram 20 MG tablet Commonly known as: Lexapro Take 1 tablet (20 mg total) by mouth daily.  Indication: Major Depressive Disorder   LORazepam 0.5 MG tablet Commonly known as: Ativan Take 2 tablets (1 mg total) by mouth every 8 (eight) hours as needed for anxiety.  Indication: Feeling Anxious   modafinil 200 MG tablet Commonly known as: Provigil Take 1 tablet (200 mg total) by mouth daily.  Indication: Major Depressive Disorder, Shift-Work Sleep Disorder   norethindrone-ethinyl estradiol 1-20 MG-MCG tablet Commonly known as: LOESTRIN Take 1 tablet by mouth daily.  Indication: Pain During Periods       SignedJohnn Hai, MD 10/26/2019, 3:56 PM

## 2019-10-26 NOTE — BHH Suicide Risk Assessment (Signed)
[facility] Discharge Suicide Risk Assessment   Principal Problem: Untreated depressive disorder Discharge Diagnoses: Active Problems:   MDD (major depressive disorder)   Total Time spent with patient: 45 minutes   Musculoskeletal: Strength & Muscle Tone: nl Gait & Station: normal Patient leans: N/A  Psychiatric Specialty Exam: Physical Exam  Nursing note and vitals reviewed. Constitutional: She appears well-developed and well-nourished.    Review of Systems  Constitutional: Positive for malaise/fatigue.  Eyes: Negative.   Cardiovascular: Negative.   Gastrointestinal: Negative.   Genitourinary: Negative.   Neurological: Negative.   Endo/Heme/Allergies: Negative.   Psychiatric/Behavioral: Positive for depression. The patient is nervous/anxious.     Blood pressure (!) 147/94, pulse 93, temperature 98.3 F (36.8 C), temperature source Oral, resp. rate 20, SpO2 99 %, unknown if currently breastfeeding.There is no height or weight on file to calculate BMI.  General Appearance: Casual  Eye Contact:  Good  Speech:  Clear and Coherent  Volume:  Normal  Mood:  Dysphoric  Affect:  Full Range  Thought Process:  Coherent, Linear and Descriptions of Associations: Circumstantial  Orientation:  Full (Time, Place, and Person)  Thought Content:  Rumination  Suicidal Thoughts:  No  Homicidal Thoughts:  No  Memory:  Immediate;   Good Recent;   Good Remote;   Good  Judgement:  Good  Insight:  Good  Psychomotor Activity:  Normal  Concentration:  Concentration: Good and Attention Span: Good  Recall:  Good  Fund of Knowledge:  Good  Language:  Good  Akathisia:  Negative  Handed:  Right  AIMS (if indicated):     Assets:  Communication Skills Desire for Improvement Financial Resources/Insurance Housing Leisure Time Physical Health Resilience  ADL's:  Intact  Cognition:  WNL  Sleep:        Demographic Factors:  Caucasian  Loss Factors: Loss of significant  relationship  Historical Factors: NA  Risk Reduction Factors:   Sense of responsibility to family and Religious beliefs about death  Continued Clinical Symptoms:  Previous Psychiatric Diagnoses and Treatments  Cognitive Features That Contribute To Risk:  None    Suicide Risk:  Minimal: No identifiable suicidal ideation.  Patients presenting with no risk factors but with morbid ruminations; may be classified as minimal risk based on the severity of the depressive symptoms    Plan Of Care/Follow-up recommendations:  Activity:  full  Daleisa Halperin, MD 10/26/2019, 4:01 PM

## 2019-10-26 NOTE — BH Assessment (Signed)
Assessment Note  Ann Williams is an 33 y.o. female presenting voluntarily and unaccompanied to [facility] for assessment. Patient reports that she "has had enough and doesn't want to do it anymore." Patient reports numerous recent losses. She states she had her son at 33 weeks in 04-13-23 and that he died in her arms. He was her second miscarriage. Patient also states she lost her grandfather last week and just flew home last night. Additionally, her father passed away 2 years ago due to alcohol use. This morning patient states she saw her husband looking at pictures of other women on his phone and that is what "made me go over the edge." She states "I just want to go be with my son. I can't take anymore." She denies having any specific suicidal plan but reports cutting her ankle this morning and her arm 1 week ago. She is unable to contract for safety.  She denies HI/AVH. Patient endorses depressive symptoms of hopelessness, anhedonia, guilt, irritability, fatigue, isolation, worthlessness and excessive guilt. Patient states she sleeps all day and has difficulty getting out of bed. Patient reports smoking THC and drinking 2 glasses of wine daily since her miscarriage. Patient denies trauma history or criminal charges.  Patient is alert and oriented x 4. She is dressed appropriately. Patient's speech is logical, eye contact is fair, and her thoughts are organized. Patient's mood is depressed and her affect is congruent. Patient's insight, judgement, and impulse control are fair. She does not appear to be responding to internal stimuli or experiencing delusional thought content.  Diagnosis: F33.2 MDD, recurrent severe  Past Medical History:  Past Medical History:  Diagnosis Date  . Depression   . Medical history non-contributory     Past Surgical History:  Procedure Laterality Date  . NO PAST SURGERIES      Family History:  Family History  Problem Relation Age of Onset  . Hypertension Mother   .  Hypertension Maternal Grandmother   . Breast cancer Neg Hx   . Ovarian cancer Neg Hx   . Colon cancer Neg Hx   . Diabetes Neg Hx     Social History:  reports that she has never smoked. She has never used smokeless tobacco. She reports that she does not drink alcohol or use drugs.  Additional Social History:  Alcohol / Drug Use Pain Medications: see MAR Prescriptions: see MAR Over the Counter: see MAR History of alcohol / drug use?: Yes Substance #1 Name of Substance 1: THC 1 - Age of First Use: 18 1 - Amount (size/oz): varies 1 - Frequency: daily 1 - Duration: 15 years 1 - Last Use / Amount: 10/25/2019 Substance #2 Name of Substance 2: Alcohol 2 - Age of First Use: 18 2 - Amount (size/oz): 2 glasses of wine 2 - Frequency: per night 2 - Duration: 6 months 2 - Last Use / Amount: 10/25/2019 2 glasses of wine  CIWA: CIWA-Ar BP: (!) 147/94 Pulse Rate: 93 COWS:    Allergies: No Known Allergies  Home Medications: (Not in a hospital admission)   OB/GYN Status:  No LMP recorded.  General Assessment Data Location of Assessment: [facility] Assessment Services TTS Assessment: In system Is this a Tele or Face-to-Face Assessment?: Face-to-Face Is this an Initial Assessment or a Re-assessment for this encounter?: Initial Assessment Patient Accompanied by:: N/A Language Other than English: No Living Arrangements: (her home) What gender do you identify as?: Female Marital status: Married Pregnancy Status: No Living Arrangements: Spouse/significant other Can pt return to  current living arrangement?: Yes Admission Status: Voluntary Is patient capable of signing voluntary admission?: Yes Referral Source: Self/Family/Friend Insurance type: [place] Living Arrangements: Spouse/significant other Legal Guardian: (self) Name of Psychiatrist: none Name of Therapist: Denice Bors  Education Status Is patient currently in school?: No Is the patient employed,  unemployed or receiving disability?: Employed  Risk to self with the past 6 months Suicidal Ideation: Yes-Currently Present Has patient been a risk to self within the past 6 months prior to admission? : No Suicidal Intent: No Has patient had any suicidal intent within the past 6 months prior to admission? : No Is patient at risk for suicide?: Yes Suicidal Plan?: No Has patient had any suicidal plan within the past 6 months prior to admission? : No Access to Means: No What has been your use of drugs/alcohol within the last 12 months?: daily alcohol and THC use Previous Attempts/Gestures: No How many times?: 0 Other Self Harm Risks: none noted Triggers for Past Attempts: None known Intentional Self Injurious Behavior: Cutting Comment - Self Injurious Behavior: cut ankle today, cut arm 1 week ago Family Suicide History: No Recent stressful life event(s): Loss (Comment), Conflict (Comment)(son and grandfather passed away; conflict with husband) Persecutory voices/beliefs?: No Depression: Yes Depression Symptoms: Despondent, Insomnia, Tearfulness, Fatigue, Isolating, Guilt, Loss of interest in usual pleasures, Feeling worthless/self pity, Feeling angry/irritable Substance abuse history and/or treatment for substance abuse?: No Suicide prevention information given to non-admitted patients: Not applicable  Risk to Others within the past 6 months Homicidal Ideation: No Does patient have any lifetime risk of violence toward others beyond the six months prior to admission? : No Thoughts of Harm to Others: No Current Homicidal Intent: No Current Homicidal Plan: No Access to Homicidal Means: No Identified Victim: none History of harm to others?: No Assessment of Violence: None Noted Violent Behavior Description: none noted Does patient have access to weapons?: No Criminal Charges Pending?: No Does patient have a court date: No Is patient on probation?: No  Psychosis Hallucinations:  None noted Delusions: None noted  Mental Status Report Appearance/Hygiene: Unremarkable Eye Contact: Good Motor Activity: Freedom of movement Speech: Logical/coherent Level of Consciousness: Alert, Crying Mood: Depressed Affect: Depressed Anxiety Level: Moderate Thought Processes: Coherent, Relevant Judgement: Impaired Orientation: Person, Place, Time, Situation Obsessive Compulsive Thoughts/Behaviors: None  Cognitive Functioning Concentration: Normal Memory: Recent Intact, Remote Intact Is patient IDD: No Insight: Fair Impulse Control: Fair Appetite: Good Have you had any weight changes? : Gain Amount of the weight change? (lbs): (UTA) Sleep: Increased Total Hours of Sleep: ("all day") Vegetative Symptoms: Staying in bed  ADLScreening [facility] Assessment Services) Patient's cognitive ability adequate to safely complete daily activities?: Yes Patient able to express need for assistance with ADLs?: Yes Independently performs ADLs?: Yes (appropriate for developmental age)  Prior Inpatient Therapy Prior Inpatient Therapy: No  Prior Outpatient Therapy Prior Outpatient Therapy: Yes Prior Therapy Dates: ongoing Prior Therapy Facilty/Provider(s): Denice Bors Reason for Treatment: therapy Does patient have an ACCT team?: No Does patient have Intensive In-House Services?  : No Does patient have [facility] services? : No Does patient have P4CC services?: No  ADL Screening (condition at time of admission) Patient's cognitive ability adequate to safely complete daily activities?: Yes Is the patient deaf or have difficulty hearing?: No Does the patient have difficulty seeing, even when wearing glasses/contacts?: No Does the patient have difficulty concentrating, remembering, or making decisions?: No Patient able to express need for assistance with ADLs?: Yes  Does the patient have difficulty dressing or bathing?: No Independently performs ADLs?: Yes (appropriate for developmental  age) Does the patient have difficulty walking or climbing stairs?: No Weakness of Legs: None Weakness of Arms/Hands: None  Home Assistive Devices/Equipment Home Assistive Devices/Equipment: None  Therapy Consults (therapy consults require a physician order) PT Evaluation Needed: No OT Evalulation Needed: No SLP Evaluation Needed: No Abuse/Neglect Assessment (Assessment to be complete while patient is alone) Abuse/Neglect Assessment Can Be Completed: Yes Physical Abuse: Denies Verbal Abuse: Denies Sexual Abuse: Denies Exploitation of patient/patient's resources: Denies Self-Neglect: Denies Values / Beliefs Cultural Requests During Hospitalization: None Spiritual Requests During Hospitalization: None Consults Spiritual Care Consult Needed: No Social Work Consult Needed: No Regulatory affairs officer (For Healthcare) Does Patient Have a Medical Advance Directive?: No Would patient like information on creating a medical advance directive?: No - Patient declined          Disposition: Mordecai Maes, NP recommends in patient treatment. Patient accepted to [facility] pending a negative Covid test. Disposition Initial Assessment Completed for this Encounter: Yes Disposition of Patient: Admit Type of inpatient treatment program: Adult Patient refused recommended treatment: No  On Site Evaluation by:   Reviewed with Physician:    Orvis Brill 10/26/2019 11:31 AM

## 2019-10-26 NOTE — Progress Notes (Signed)
D: Pt A & O X 4. Denies SI, HI, AVH and pain at this time. Presents with fair eye contact, flat affect and depressed mood, speech is logical and clear. D/C home as ordered post MD's assessment.  A: D/C instructions reviewed with pt including prescriptions and follow up appointment; compliance encouraged. All belongings from locker # 60 given to pt at time of departure. Safety checks maintained without incident till time of d/c.  R: Pt receptive to care. Verbalized understanding related to d/c instructions. Signed belonging sheet in agreement with items received from locker. Ambulatory with a steady gait. Appears to be in no physical distress at time of departure.

## 2019-10-26 NOTE — H&P (Signed)
Behavioral Health Medical Screening Exam  Ann Williams is an 33 y.o. female.who presents to [facility] voluntarily, as a walk-in. Patient was    Patient reports she had a miscarriage 6 months ago and reports this as her second miscarriage. Patient reports it was recommended by her therapist that she comes to [facility] for evaluation. Reports her grandfather passed away last week and she returned from his funeral yesterday. Reports her father passed away two years ago. Reports a strained relationship with her husband reporting yesterday, she found him looking at pictures of another female in his phone states," this was the tip of the iceberg."  She endorses worsening depression and anxiety and although she denies any suicidal thoughts she states,: I have have thoughts that I just want to be with my son" whom she recently miscarriaged. She reports a history of cutting behaviors to feel pain and stress and report she started cutting again after many years once last week and once today (sueprficial). She states,: I cant make a promise that I will not cut myself again because everything I have tried to do, is not working." She denies homicidal ideations, psychosis, or history of suicide attempts. She reports she was started on Zoloft by her OBGYN and reports after the medication was increased, it caused her to feel more anxious so she stopped it last Sunday. She denies other psychotropic medications used int he past. She denies any previous psychiatric hospitalizations. Denies history of physical, sexual or emotional abuse. Reports smoking marijuana daily although denies other substance abuse or use. Reports family history of psychiatric illness as mother- depression and anxiety and father-bipolar and alcohol abuse. She reports limited social support from husband. Reports mother and other social supports lives in [place].    Total Time spent with patient: 15 minutes  Psychiatric Specialty Exam: Physical Exam   Vitals reviewed. Constitutional: She is oriented to person, place, and time.  Neurological: She is alert and oriented to person, place, and time.    Review of Systems  Psychiatric/Behavioral: Positive for depression. Negative for hallucinations, memory loss, substance abuse and suicidal ideas. The patient is nervous/anxious. The patient does not have insomnia.   All other systems reviewed and are negative.   Blood pressure (!) 147/94, pulse 93, temperature 98.3 F (36.8 C), temperature source Oral, resp. rate 20, SpO2 99 %, unknown if currently breastfeeding.There is no height or weight on file to calculate BMI.  General Appearance: Casual  Eye Contact:  Good  Speech:  Clear and Coherent and Normal Rate  Volume:  Decreased  Mood:  Depressed  Affect:  Depressed and Tearful  Thought Process:  Coherent, Linear and Descriptions of Associations: Intact  Orientation:  Full (Time, Place, and Person)  Thought Content:  Logical  Suicidal Thoughts:  No  Homicidal Thoughts:  No  Memory:  Immediate;   Fair Recent;   Fair  Judgement:  Fair  Insight:  Fair  Psychomotor Activity:  Normal  Concentration: Concentration: Fair and Attention Span: Fair  Recall:  [employer] of Knowledge:Fair  Language: Good  Akathisia:  Negative  Handed:  Right  AIMS (if indicated):     Assets:  Communication Skills Desire for Improvement Resilience  Sleep:       Musculoskeletal: Strength & Muscle Tone: within normal limits Gait & Station: normal Patient leans: N/A  Blood pressure (!) 147/94, pulse 93, temperature 98.3 F (36.8 C), temperature source Oral, resp. rate 20, SpO2 99 %, unknown if currently breastfeeding.  Recommendations:  Based on my evaluation the patient does not appear to have an emergency medical condition.   Based off of my evaluation and the number of risk factors, patient does meet criteria for inpatient psychiatric hospitalization. She has been assigned bed 300-1 here at [facility], NP 10/26/2019, 11:26 AM

## 2020-04-12 ENCOUNTER — Telehealth: Payer: Self-pay

## 2020-04-12 NOTE — Telephone Encounter (Signed)
Called and spoke to Dr. Kennith Maes nurse Linus Orn and let her know that Dr. Alen Blew prefers patient not take estrogen based birth control because it might increase risk of blood clots. Tracey verbalized understanding and stated she will inform Dr. Ronita Hipps.

## 2020-04-12 NOTE — Telephone Encounter (Signed)
-----   Message from Wyatt Portela, MD sent at 04/12/2020  9:20 AM EDT ----- I prefer that she does not.  Estrogen-based birth control might increase her risk of blood clots.  Thanks ----- Message ----- From: Tami Lin, RN Sent: 04/12/2020   8:49 AM EDT To: Wyatt Portela, MD  Dr. Crissie Reese nurse called and stated Dr. Ronita Hipps wants to know if you are ok with patient taking birth control with estrogen due to her history of antiphospholipid antibody syndrome.  Ann Williams

## 2020-05-16 IMAGING — US US OB COMP LESS 14 WK
1 series · 15 of 22 positions shown · non-contrast
Comparison: None.

CLINICAL DATA: Heavy bleeding. Estimated gestational age by LMP is
8 weeks 6 days. No quantitative beta hCG given.

EXAM:
OBSTETRIC <14 WK ULTRASOUND
TECHNIQUE: Transabdominal ultrasound was performed for evaluation of the
gestation as well as the maternal uterus and adnexal regions.

[Series 1: us ob comp less 14 wk · 22 acquisitions, 15 frames shown]
[im 1/22]
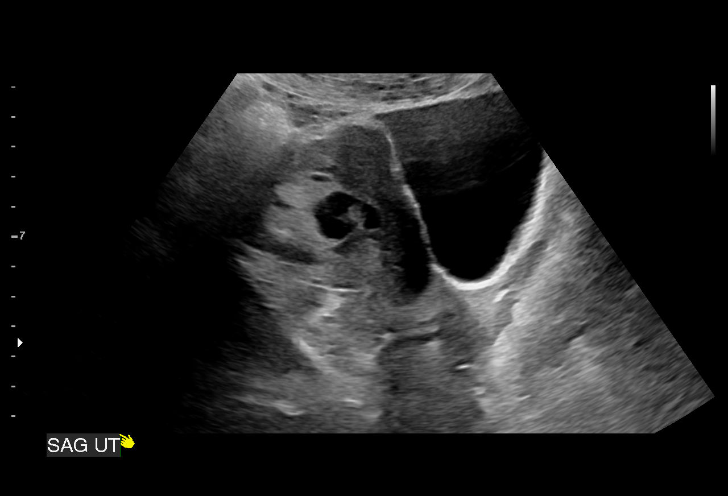
[im 3/22]
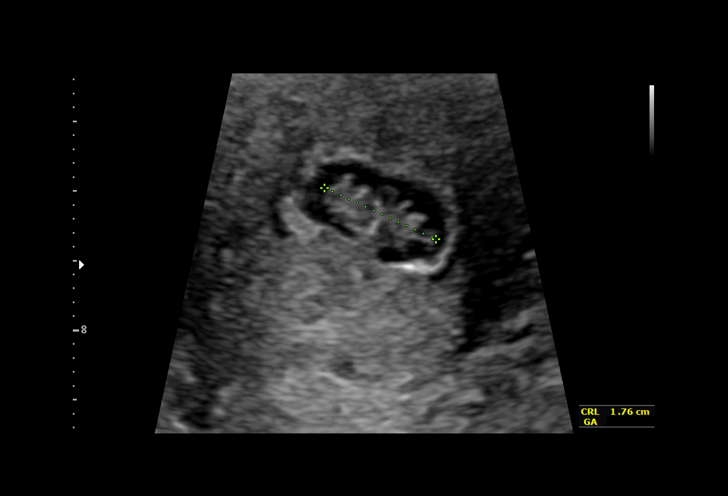
[im 4/22]
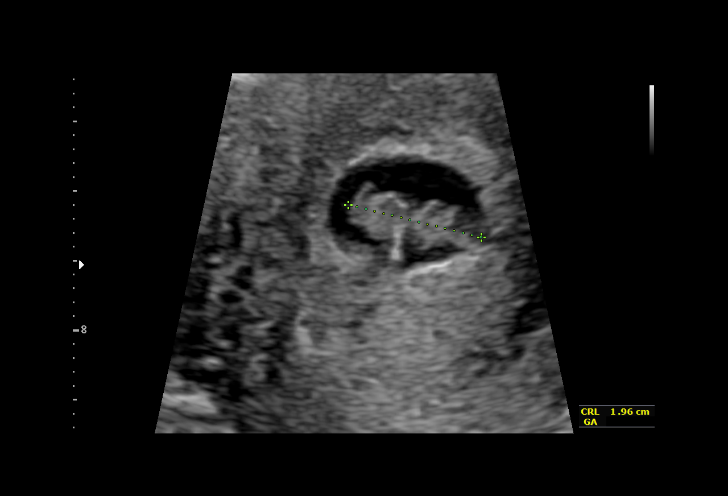
[im 6/22]
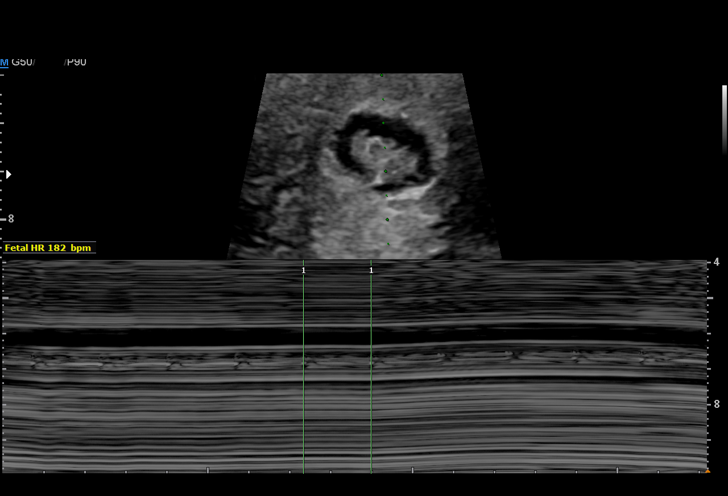
[im 7/22]
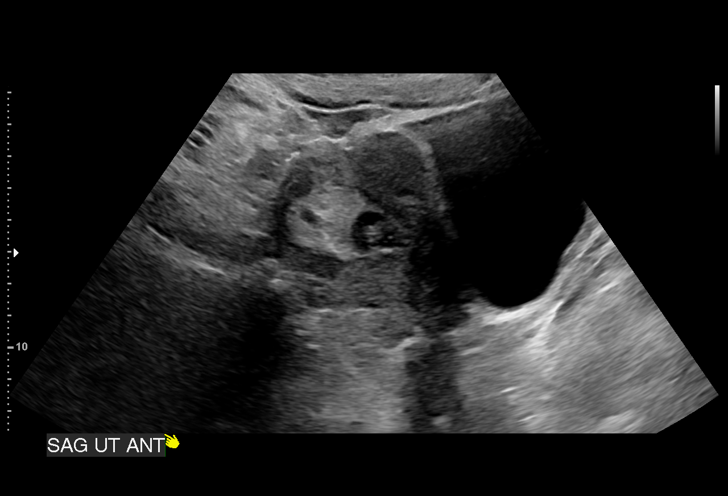
[im 9/22]
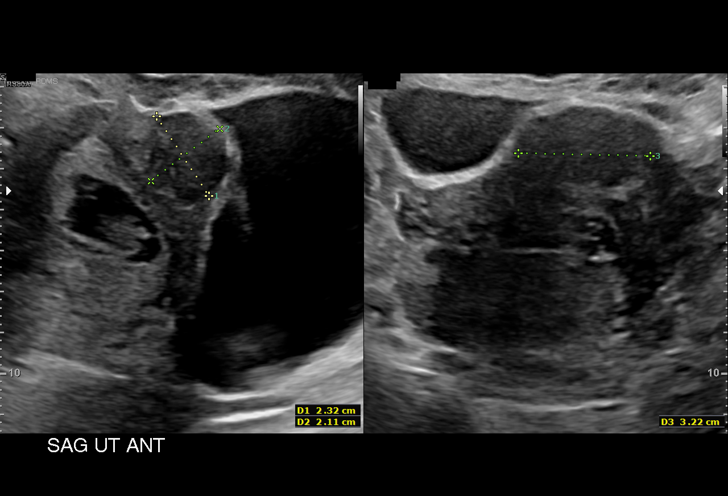
[im 10/22]
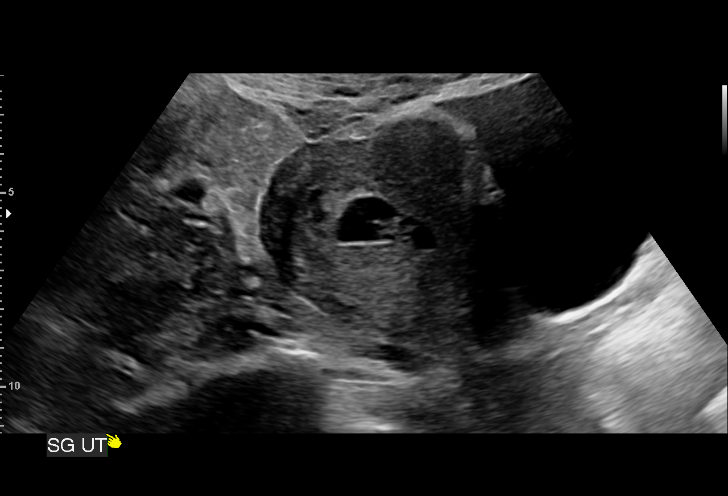
[im 12/22]
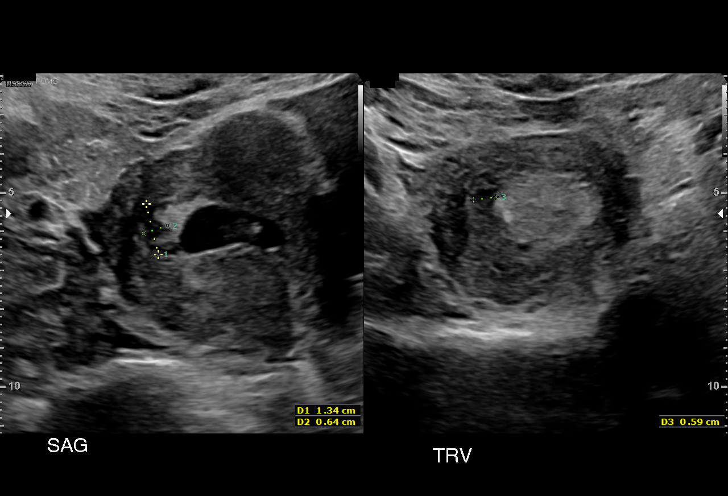
[im 13/22]
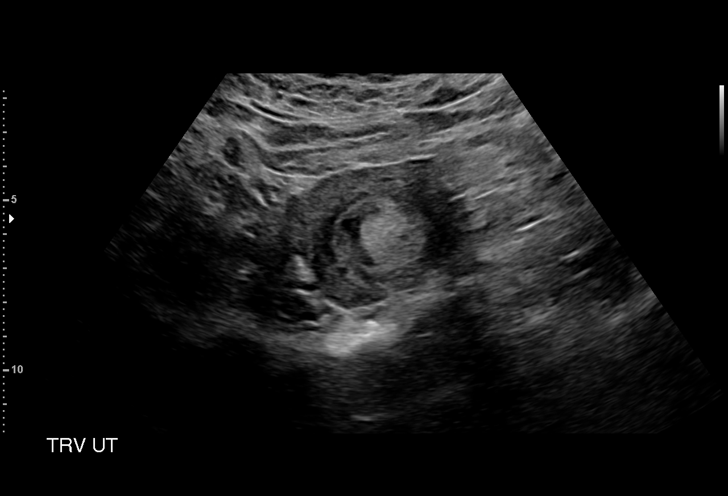
[im 14/22]
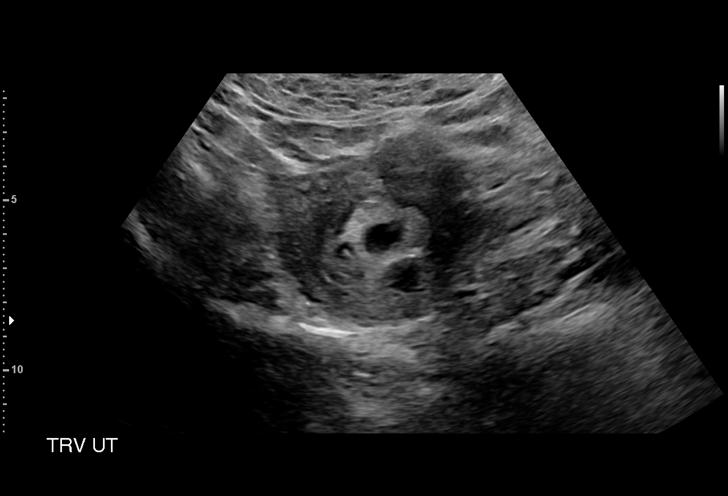
[im 16/22]
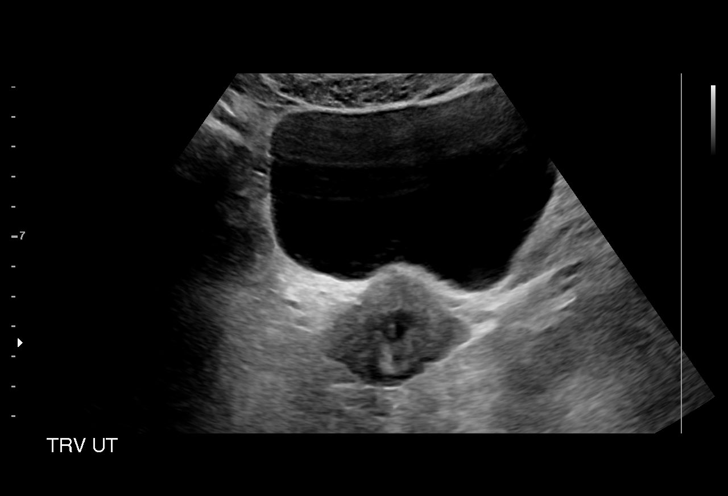
[im 17/22]
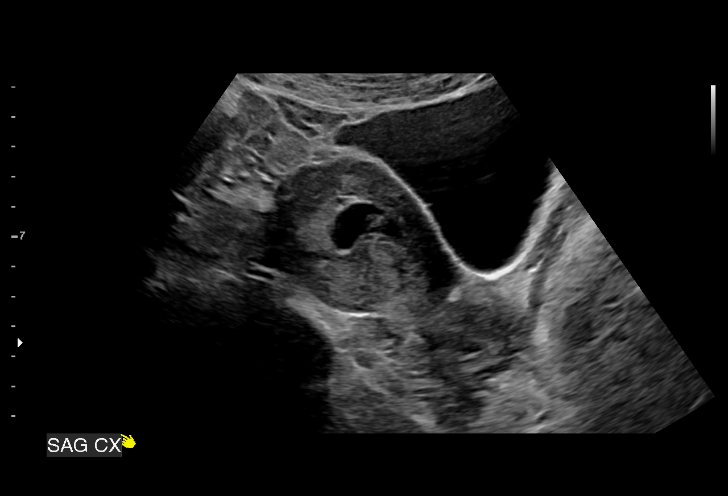
[im 19/22]
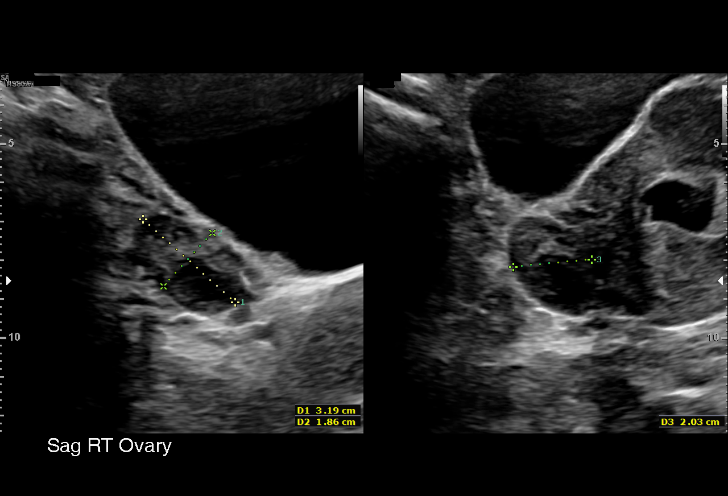
[im 20/22]
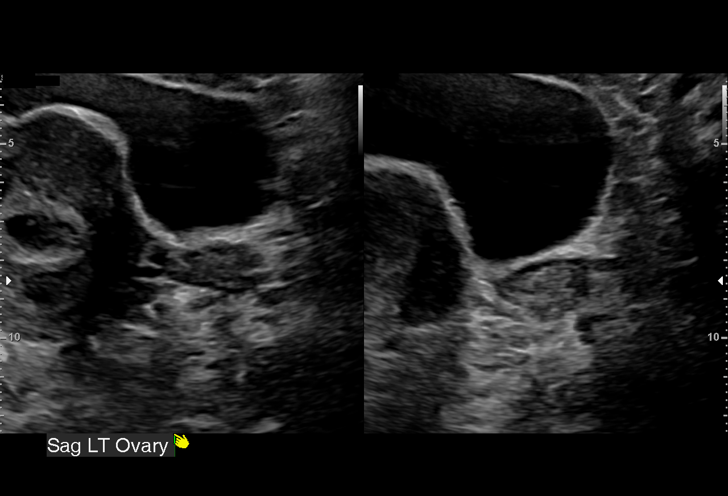
[im 22/22]
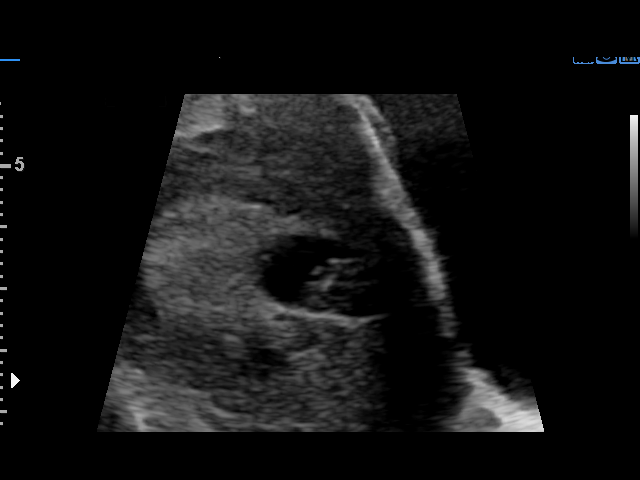

[15 of 22 positions shown; findings below may reference images not displayed]

FINDINGS: Intrauterine gestational sac: A single intrauterine gestational sac
is present. The gestational sac is somewhat flattened, possibly
related to uterine fibroid.

Yolk sac:  Yolk sac is identified.

Embryo:  Fetal pole is visualized.

Cardiac Activity: Fetal cardiac activity is observed.

Heart Rate: 182 bpm

CRL: 18.7 mm   8 w 2 d                  US EDC: 07/03/2019

Subchorionic hemorrhage: A small subchorionic hemorrhage is
demonstrated superiorly.

Maternal uterus/adnexae: Uterus is anteverted. Hypoechoic myometrial
mass demonstrated in the anterior uterine fundus measuring up to
about 3.2 cm in diameter consistent with a fibroid. Both ovaries are
visualized and appear normal. No free fluid in the pelvis.
IMPRESSION: Single intrauterine pregnancy. Estimated gestational age by
crown-rump length is 8 weeks 2 days. Small subchorionic hemorrhage.
An anterior uterine fibroid is present.

## 2020-08-02 IMAGING — US US MFM OB DETAIL+14 WK
1 series · 12 of 28 positions shown · non-contrast
Comparison: none

[Series 1: us mfm ob detail+14 wk · 72 acquisitions, 12 frames shown]
[im 3/72]
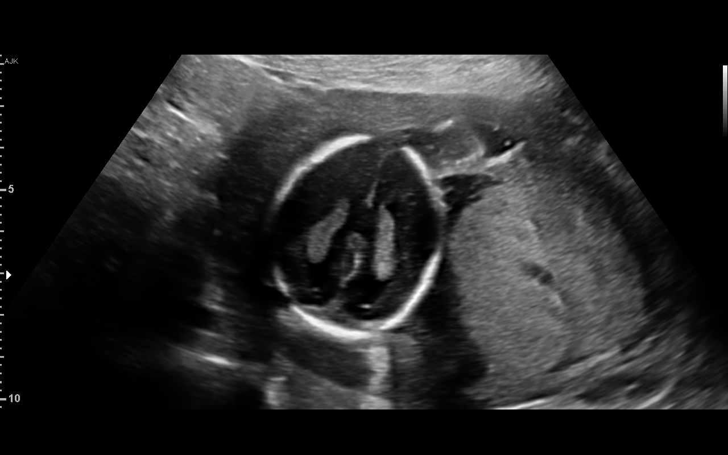
[im 8/72]
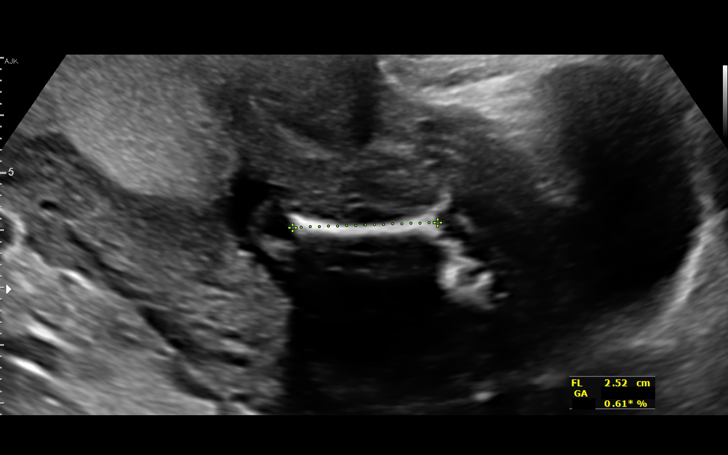
[im 14/72]
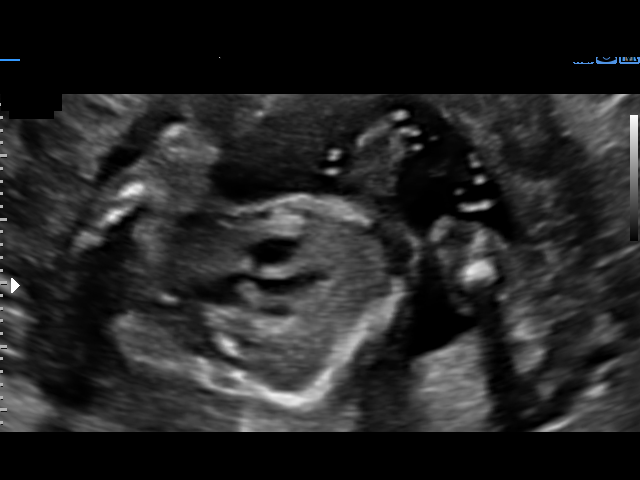
[im 22/72]
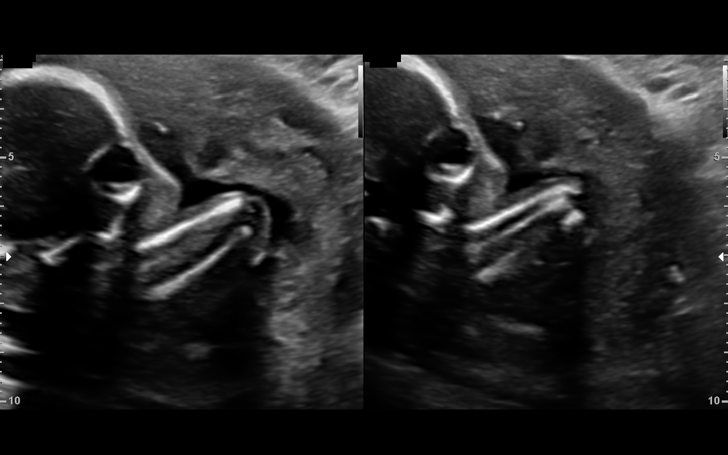
[im 27/72]
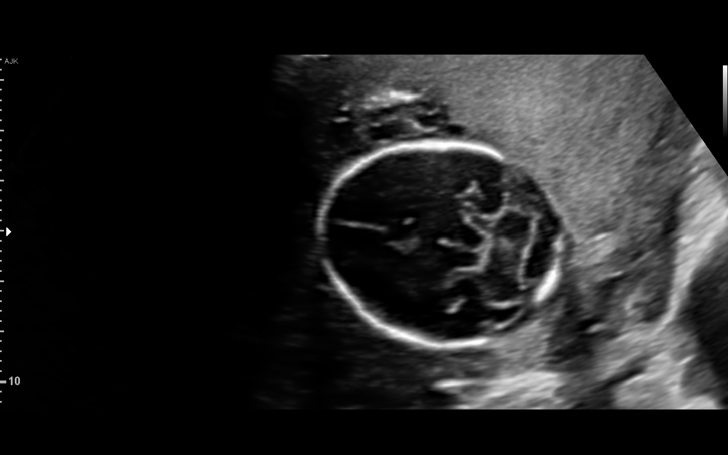
[im 32/72]
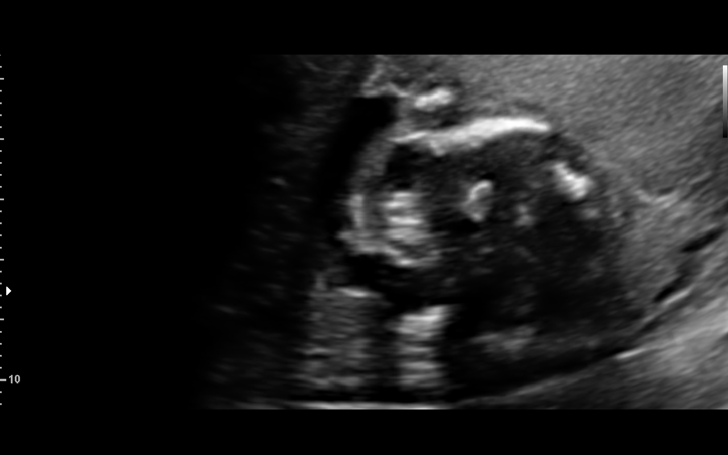
[im 40/72]
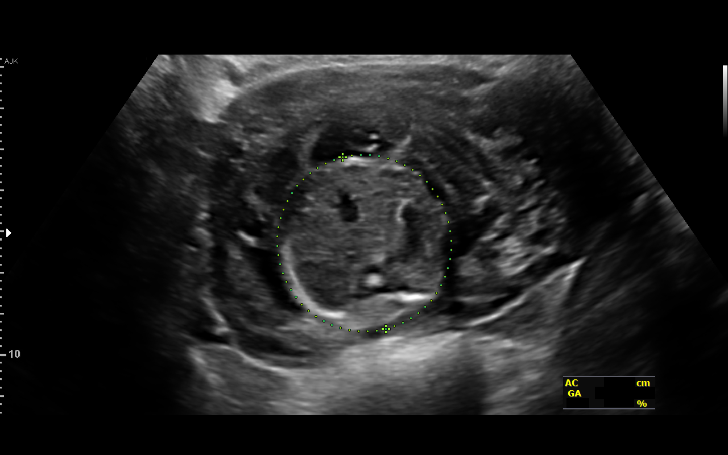
[im 45/72]
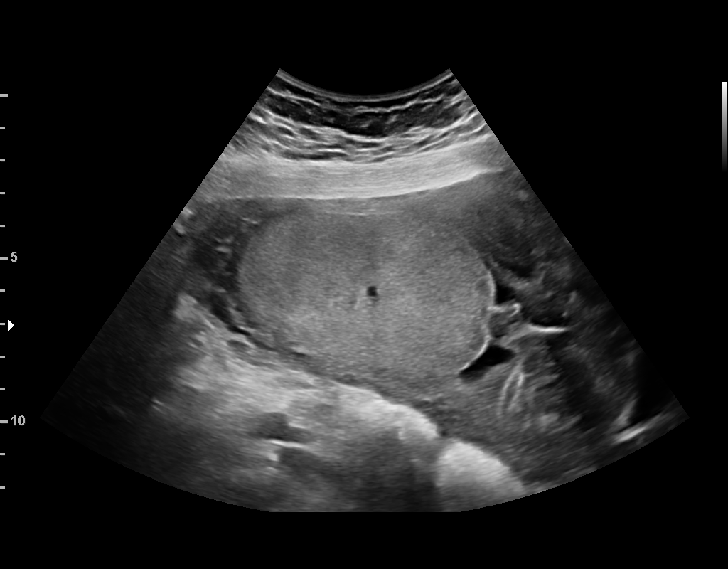
[im 50/72]
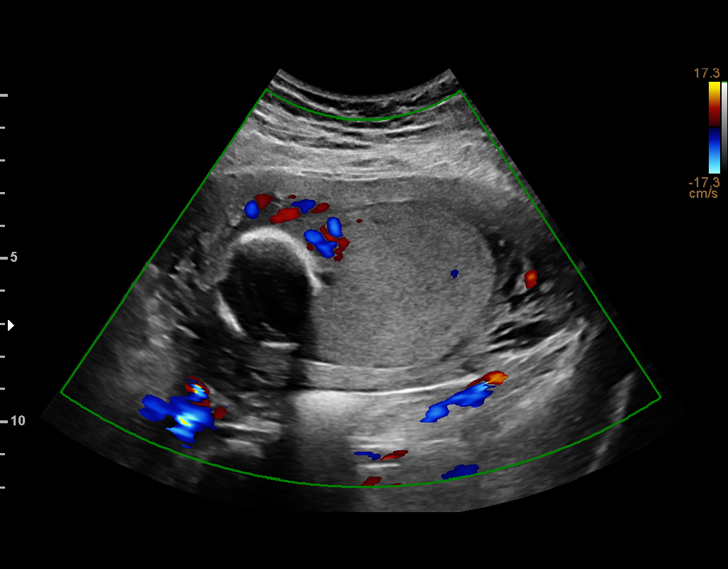
[im 58/72]
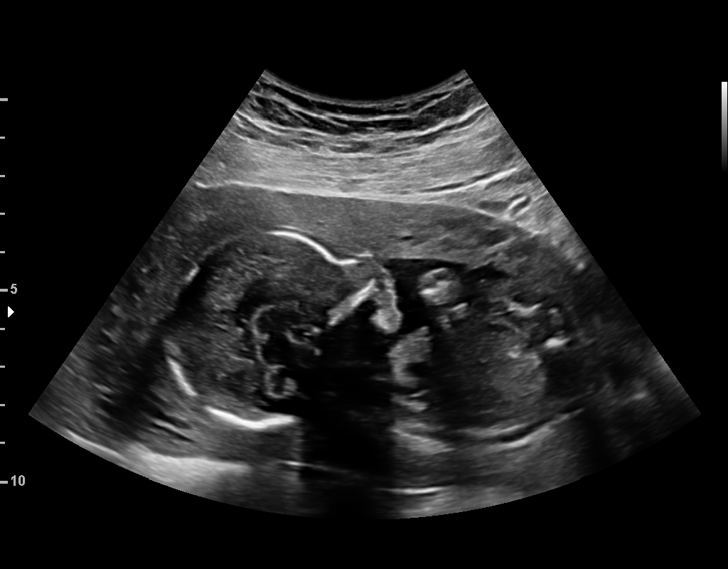
[im 64/72]
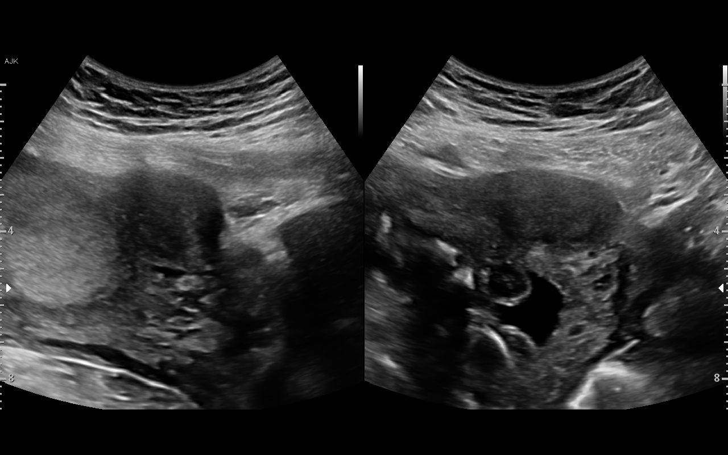
[im 69/72]
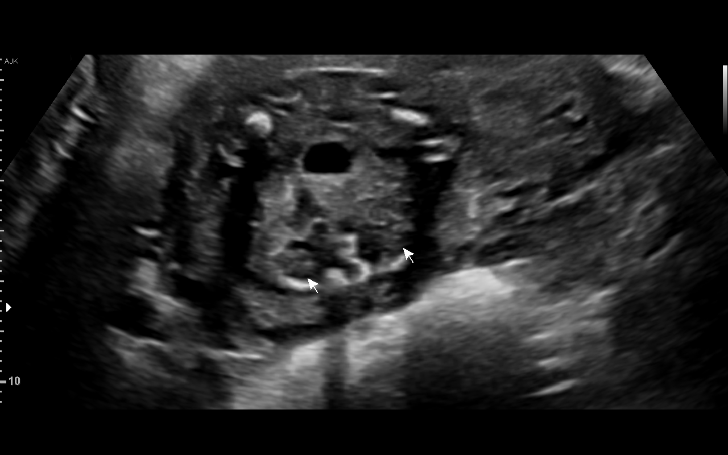

[12 of 28 positions shown; findings below may reference images not displayed]

OB/GYN &
                                                            Infertility
                                                            [id number] [HOSPITAL]

  2  US MFM UA CORD DOPPLER               76820.02     [place]
 ----------------------------------------------------------------------

 ----------------------------------------------------------------------
Indications

  Encounter for antenatal screening for
  malformations (NML NT, neg AFP)
  Oligohydramnios / Decreased amniotic fluid
  volume
  Small for gestational age fetus affecting
  management of mother
  20 weeks gestation of pregnancy
 ----------------------------------------------------------------------
Vital Signs

 BMI:
Fetal Evaluation

 Num Of Fetuses:         1
 Fetal Heart Rate(bpm):  155
 Cardiac Activity:       Observed
 Presentation:           Breech
 Placenta:               Anterior
 P. Cord Insertion:      Visualized

 Amniotic Fluid
 AFI FV:      Subjectively decreased

                             Largest Pocket(cm)

Biometry

 BPD:      39.2  mm     G. Age:  18w 0d        < 1  %    CI:        71.27   %    70 - 86
                                                         FL/HC:      16.8   %    16.8 -
 HC:      147.9  mm     G. Age:  17w 6d        < 3  %    HC/AC:      1.08        1.09 -
 AC:      137.1  mm     G. Age:  19w 1d         20  %    FL/BPD:     63.3   %
 FL:       24.8  mm     G. Age:  17w 4d        < 3  %    FL/AC:      18.1   %    20 - 24
 CER:      21.1  mm     G. Age:  20w 1d         52  %

 LV:        5.2  mm

 Est. FW:     234  gm      0 lb 8 oz   < 25  %
OB History

 Gravidity:    2          SAB:   1
Gestational Age

 LMP:           20w 0d        Date:  09/22/18                 EDD:   06/29/19
 U/S Today:     18w 1d                                        EDD:   07/12/19
 Best:          20w 0d     Det. By:  LMP  (09/22/18)          EDD:   06/29/19
Anatomy

 Cranium:               Appears normal         Aortic Arch:            Not well visualized
 Cavum:                 Appears normal         Ductal Arch:            Appears normal
 Ventricles:            Appears normal         Diaphragm:              Not well visualized
 Choroid Plexus:        Appears normal         Stomach:                Appears normal, left
                                                                       sided
 Cerebellum:            Appears normal         Abdomen:                Appears normal
 Posterior Fossa:       Appears normal         Abdominal Wall:         Appears nml (cord
                                                                       insert, abd wall)
 Nuchal Fold:           Not applicable (>20    Cord Vessels:           Appears normal (3
                        wks GA)                                        vessel cord)
 Face:                  Appears normal         Kidneys:                Appear normal
                        (orbits and profile)
 Lips:                  Not well visualized    Bladder:                Appears normal
 Thoracic:              Appears normal         Spine:                  Not well visualized
 Heart:                 Not well visualized    Upper Extremities:      Appears normal
 RVOT:                  Not well visualized    Lower Extremities:      Appears normal
 LVOT:                  Appears normal

 Other:  Parents do not wish to know sex of fetus. Nasal bone visualized.
Doppler - Fetal Vessels

 Umbilical Artery
  S/D     %tile     RI              PI                             RDFV
 5.58       91   0.82             1.58                                No

Cervix Uterus Adnexa

 Cervix
 Length:           3.11  cm.
 Normal appearance by transabdominal scan.

 Uterus
 Single fibroid noted, see table below.
 Left Ovary
 Not visualized.

 Right Ovary
 Not visualized.

 Adnexa
 No abnormality visualized.
Myomas

  Site                     L(cm)      W(cm)      D(cm)      Location
  Anterior                 3.5        2.8        4          Intramural
 ----------------------------------------------------------------------

  Blood Flow                 RI        PI       Comments

 ----------------------------------------------------------------------
Impression

 Ms. Ayuob, Sekiko2 P0 at 20-weeks' gestation, is here for a
 second opinion. On your office ultrasound, oligohydramnios
 with fetal growth restriction was suspected. She was
 accompanied by her husband.

 Her pregnancy is well-dated by sure LMP that is (dating) also
 consistent with early ultrasound. On first-trimester screening,
 the risks of fetal aneuploidies are not increased. MSAFP
 screening showed low risk for open spina bifida. MSAFP was
 slightly increased (2.21 MoM).
 Patient had early pregnancy bleeding that resolved. She does
 not report any chronic medical conditions and she does not
 smoke cigarettes or use alcohol or drugs.

 We performed fetal anatomical survey. No obvious markers
 of aneuploidies or fetal structural defects are seen. Amniotic
 fluid is subjectively decreased. However, the deepest vertical
 pocket measured 2.6 cm. Fetal anatomical survey was
 somewhat limited because of fetal position. Fetal biometry
 lagged gestational age by 2 weeks.

 We performed umbilical artery Doppler study because of
 suspicion of fetal growth restriction. Pulsatality index was
 increased (S/D ratio ranged between 5.6 to 10).
 A small anterior intramural myoma is seen (measurements
 above).

 I explained the findings suggestive of early-onset fetal growth
 restriction. I also informed her that it is too early to definitively
 make the diagnosis.

 I discussed the possible causes of fetal growth restriction.
 Placental insufficiency is the most-likely cause. One of the
 causes of placental insufficiency is maternal
 hypertension/preeclampsia that may develop later. I also
 discussed the possibility of fetal chromosomal anomalies or
 genetic syndromes that only amniocentesis will identify.
 Amniocentesis can be technically challenging to perform
 given that amniotic fluid is decreased.

 I recommended that we reevaluate in 3 weeks to assess fetal
 growth. I also recommend weekly fetal heart evaluation and
 check her blood pressures. Blood pressure at our office today
 is 140/76 mm Hg. The couple understands that diagnosis of
 fetal growth restriction is uncertain at this stage. They also
 understand that if growth restriction is present there is a
 likelihood of fetal demise.
Recommendations

 -An appointment was made for her to return in 3 weeks for
 fetal growth assessment and Doppler studies.
 -Weekly fetal heart check.
 -Weekly BP checks.
                 Karleen, Ravario

## 2020-08-08 ENCOUNTER — Telehealth: Payer: Self-pay

## 2020-08-08 NOTE — Telephone Encounter (Signed)
Received TC from patient stating  that she saw Dr Alen Blew last year about 2 times for a blood clotting issue. She wants to know if he thinks that she should get the covid vaccine. Sent staff message to Dr Alen Blew of patient question. Dr Alen Blew made aware

## 2020-08-09 ENCOUNTER — Telehealth: Payer: Self-pay

## 2020-08-09 NOTE — Telephone Encounter (Signed)
Received return call from pt about question yesterday. Let pt know that Dr Alen Blew did recommend that she get covid vaccine. Pt verbalized understanding.

## 2020-08-09 NOTE — Telephone Encounter (Signed)
Called and informed patient Dr. Alen Blew recommends for patient to receive the Covid vaccine. Patient verbalized understanding.

## 2020-08-09 NOTE — Telephone Encounter (Signed)
-----   Message from Will Bonnet, LPN sent at 9/45/8592  7:52 AM EDT ----- Regarding: FW: question about covid vaccine  ----- Message ----- From: Wyatt Portela, MD Sent: 08/09/2020   7:26 AM EDT To: Will Bonnet, LPN Subject: RE: question about covid vaccine               Yes. She should get it. Thanks ----- Message ----- From: Will Bonnet, LPN Sent: 09/01/4461   3:58 PM EDT To: Wyatt Portela, MD Subject: question about covid vaccine                   Patient called stating that she saw you last year about 2 times for a blood clotting issue. She wants to know if you think that she should get the covid vaccine.
